# Patient Record
Sex: Male | Born: 1970
Health system: Southern US, Community
[De-identification: ages and names within clinical notes are randomized; demographics above are authoritative.]

## PROBLEM LIST (undated history)

## (undated) DIAGNOSIS — B20 Human immunodeficiency virus [HIV] disease: Secondary | ICD-10-CM

## (undated) DIAGNOSIS — Z21 Asymptomatic human immunodeficiency virus [HIV] infection status: Secondary | ICD-10-CM

## (undated) DIAGNOSIS — T7840XA Allergy, unspecified, initial encounter: Secondary | ICD-10-CM

## (undated) HISTORY — DX: Asymptomatic human immunodeficiency virus (hiv) infection status: Z21

## (undated) HISTORY — DX: Human immunodeficiency virus (HIV) disease: B20

## (undated) HISTORY — DX: Allergy, unspecified, initial encounter: T78.40XA

---

## 2007-07-18 ENCOUNTER — Encounter: Admission: RE | Admit: 2007-07-18 | Discharge: 2007-07-18 | Payer: Self-pay | Admitting: Internal Medicine

## 2007-07-19 ENCOUNTER — Inpatient Hospital Stay (HOSPITAL_COMMUNITY): Admission: EM | Admit: 2007-07-19 | Discharge: 2007-08-02 | Payer: Self-pay | Admitting: Emergency Medicine

## 2007-07-19 ENCOUNTER — Ambulatory Visit: Payer: Self-pay | Admitting: Internal Medicine

## 2007-07-21 ENCOUNTER — Encounter (INDEPENDENT_AMBULATORY_CARE_PROVIDER_SITE_OTHER): Payer: Self-pay | Admitting: *Deleted

## 2007-07-21 ENCOUNTER — Ambulatory Visit: Payer: Self-pay | Admitting: Infectious Diseases

## 2007-07-21 LAB — CONVERTED CEMR LAB: HCV Ab: NEGATIVE

## 2007-07-28 ENCOUNTER — Encounter (INDEPENDENT_AMBULATORY_CARE_PROVIDER_SITE_OTHER): Payer: Self-pay | Admitting: *Deleted

## 2007-07-28 ENCOUNTER — Encounter: Payer: Self-pay | Admitting: Internal Medicine

## 2007-07-28 LAB — CONVERTED CEMR LAB
Hep A Total Ab: NEGATIVE
Hepatitis B Surface Ag: NEGATIVE

## 2007-08-11 ENCOUNTER — Telehealth: Payer: Self-pay | Admitting: Internal Medicine

## 2007-08-14 ENCOUNTER — Telehealth: Payer: Self-pay | Admitting: Internal Medicine

## 2007-08-15 ENCOUNTER — Encounter (INDEPENDENT_AMBULATORY_CARE_PROVIDER_SITE_OTHER): Payer: Self-pay | Admitting: *Deleted

## 2007-08-15 ENCOUNTER — Ambulatory Visit: Payer: Self-pay | Admitting: Internal Medicine

## 2007-08-15 DIAGNOSIS — A310 Pulmonary mycobacterial infection: Secondary | ICD-10-CM | POA: Insufficient documentation

## 2007-08-15 DIAGNOSIS — Z87898 Personal history of other specified conditions: Secondary | ICD-10-CM | POA: Insufficient documentation

## 2007-08-15 DIAGNOSIS — A630 Anogenital (venereal) warts: Secondary | ICD-10-CM | POA: Insufficient documentation

## 2007-08-15 DIAGNOSIS — B20 Human immunodeficiency virus [HIV] disease: Secondary | ICD-10-CM | POA: Insufficient documentation

## 2007-08-15 DIAGNOSIS — B59 Pneumocystosis: Secondary | ICD-10-CM | POA: Insufficient documentation

## 2007-08-15 LAB — CONVERTED CEMR LAB

## 2007-09-06 ENCOUNTER — Encounter: Payer: Self-pay | Admitting: Internal Medicine

## 2007-09-26 ENCOUNTER — Encounter: Admission: RE | Admit: 2007-09-26 | Discharge: 2007-09-26 | Payer: Self-pay | Admitting: Internal Medicine

## 2007-09-26 ENCOUNTER — Ambulatory Visit: Payer: Self-pay | Admitting: Internal Medicine

## 2007-09-26 LAB — CONVERTED CEMR LAB
Alkaline Phosphatase: 69 units/L (ref 39–117)
CO2: 24 meq/L (ref 19–32)
Cholesterol: 163 mg/dL (ref 0–200)
Creatinine, Ser: 0.91 mg/dL (ref 0.40–1.50)
Glucose, Bld: 76 mg/dL (ref 70–99)
HCT: 40.7 % (ref 39.0–52.0)
HDL: 75 mg/dL (ref >39)
HIV-1 RNA Quant, Log: 1.74 — ABNORMAL HIGH (ref <1.70)
LDL Cholesterol: 74 mg/dL (ref 0–99)
MCHC: 32.7 g/dL (ref 30.0–36.0)
MCV: 85.7 fL (ref 78.0–100.0)
Platelets: 305 10*3/uL (ref 150–400)
RBC: 4.75 M/uL (ref 4.22–5.81)
Sodium: 139 meq/L (ref 135–145)
Total Bilirubin: 0.2 mg/dL — ABNORMAL LOW (ref 0.3–1.2)
Total CHOL/HDL Ratio: 2.2
Total Protein: 7.8 g/dL (ref 6.0–8.3)
Triglycerides: 68 mg/dL (ref <150)
VLDL: 14 mg/dL (ref 0–40)

## 2007-10-04 ENCOUNTER — Encounter (INDEPENDENT_AMBULATORY_CARE_PROVIDER_SITE_OTHER): Payer: Self-pay | Admitting: *Deleted

## 2007-10-06 ENCOUNTER — Telehealth: Payer: Self-pay | Admitting: Internal Medicine

## 2007-10-10 ENCOUNTER — Ambulatory Visit: Payer: Self-pay | Admitting: Internal Medicine

## 2007-10-10 DIAGNOSIS — J309 Allergic rhinitis, unspecified: Secondary | ICD-10-CM | POA: Insufficient documentation

## 2007-11-28 ENCOUNTER — Encounter: Payer: Self-pay | Admitting: Internal Medicine

## 2007-11-28 ENCOUNTER — Ambulatory Visit: Payer: Self-pay | Admitting: Internal Medicine

## 2008-01-01 ENCOUNTER — Ambulatory Visit: Payer: Self-pay | Admitting: Internal Medicine

## 2008-01-01 ENCOUNTER — Encounter: Admission: RE | Admit: 2008-01-01 | Discharge: 2008-01-01 | Payer: Self-pay | Admitting: Internal Medicine

## 2008-01-01 LAB — CONVERTED CEMR LAB
ALT: 29 units/L (ref 0–53)
AST: 20 units/L (ref 0–37)
Alkaline Phosphatase: 78 units/L (ref 39–117)
BUN: 9 mg/dL (ref 6–23)
Calcium: 9.3 mg/dL (ref 8.4–10.5)
Chloride: 104 meq/L (ref 96–112)
Creatinine, Ser: 0.75 mg/dL (ref 0.40–1.50)
HCT: 41.7 % (ref 39.0–52.0)
Hemoglobin: 13.9 g/dL (ref 13.0–17.0)
MCHC: 33.3 g/dL (ref 30.0–36.0)
MCV: 86.5 fL (ref 78.0–100.0)
Platelets: 339 10*3/uL (ref 150–400)
RDW: 14.4 % (ref 11.5–15.5)
Total Bilirubin: 0.3 mg/dL (ref 0.3–1.2)

## 2008-01-16 ENCOUNTER — Ambulatory Visit: Payer: Self-pay | Admitting: Internal Medicine

## 2008-02-09 ENCOUNTER — Encounter: Payer: Self-pay | Admitting: Internal Medicine

## 2008-04-29 ENCOUNTER — Ambulatory Visit: Payer: Self-pay | Admitting: Internal Medicine

## 2008-04-29 LAB — CONVERTED CEMR LAB
HIV 1 RNA Quant: <48 copies/mL (ref <48)
HIV-1 RNA Quant, Log: <1.68 (ref <1.68)

## 2008-06-04 ENCOUNTER — Ambulatory Visit: Payer: Self-pay | Admitting: Internal Medicine

## 2008-07-11 ENCOUNTER — Encounter: Payer: Self-pay | Admitting: Internal Medicine

## 2008-08-07 IMAGING — CR DG CHEST 1V PORT
1 series · 1 of 1 positions shown · non-contrast
Comparison: 07/23/07 and 07/20/07.

CLINICAL DATA: Cough/short of breath/follow-up pneumonia.
 PORTABLE CHEST - 1 VIEW 07/24/07:

[view not recorded]
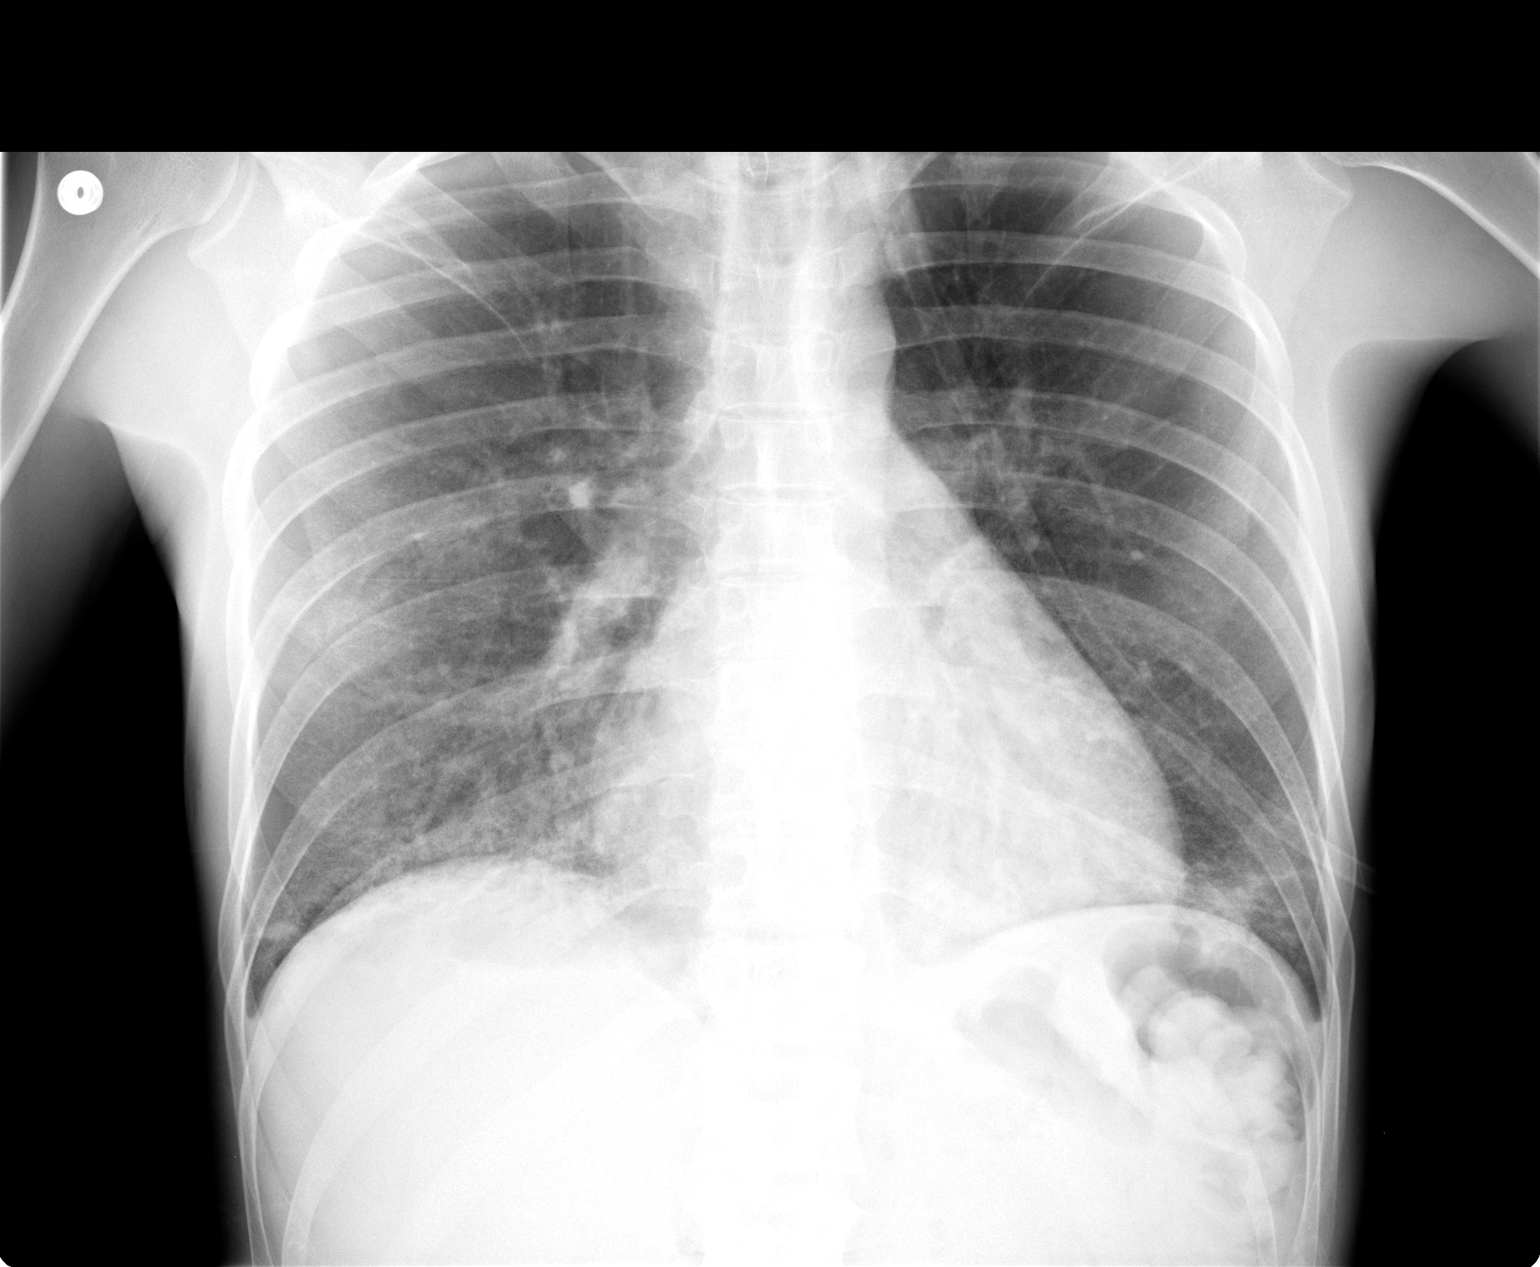

[1 of 1 positions shown; findings below may reference images not displayed]

FINDINGS: Bilateral lower lobe airspace densities about the same.  Borderline cardiomegaly without failure.
IMPRESSION: Bilateral lower lobe airspace disease without significant change.

## 2008-08-12 IMAGING — CR DG CHEST 2V
2 series · 2 of 2 positions shown · non-contrast
Comparison: none

CLINICAL DATA: Fever

[w chest pa]
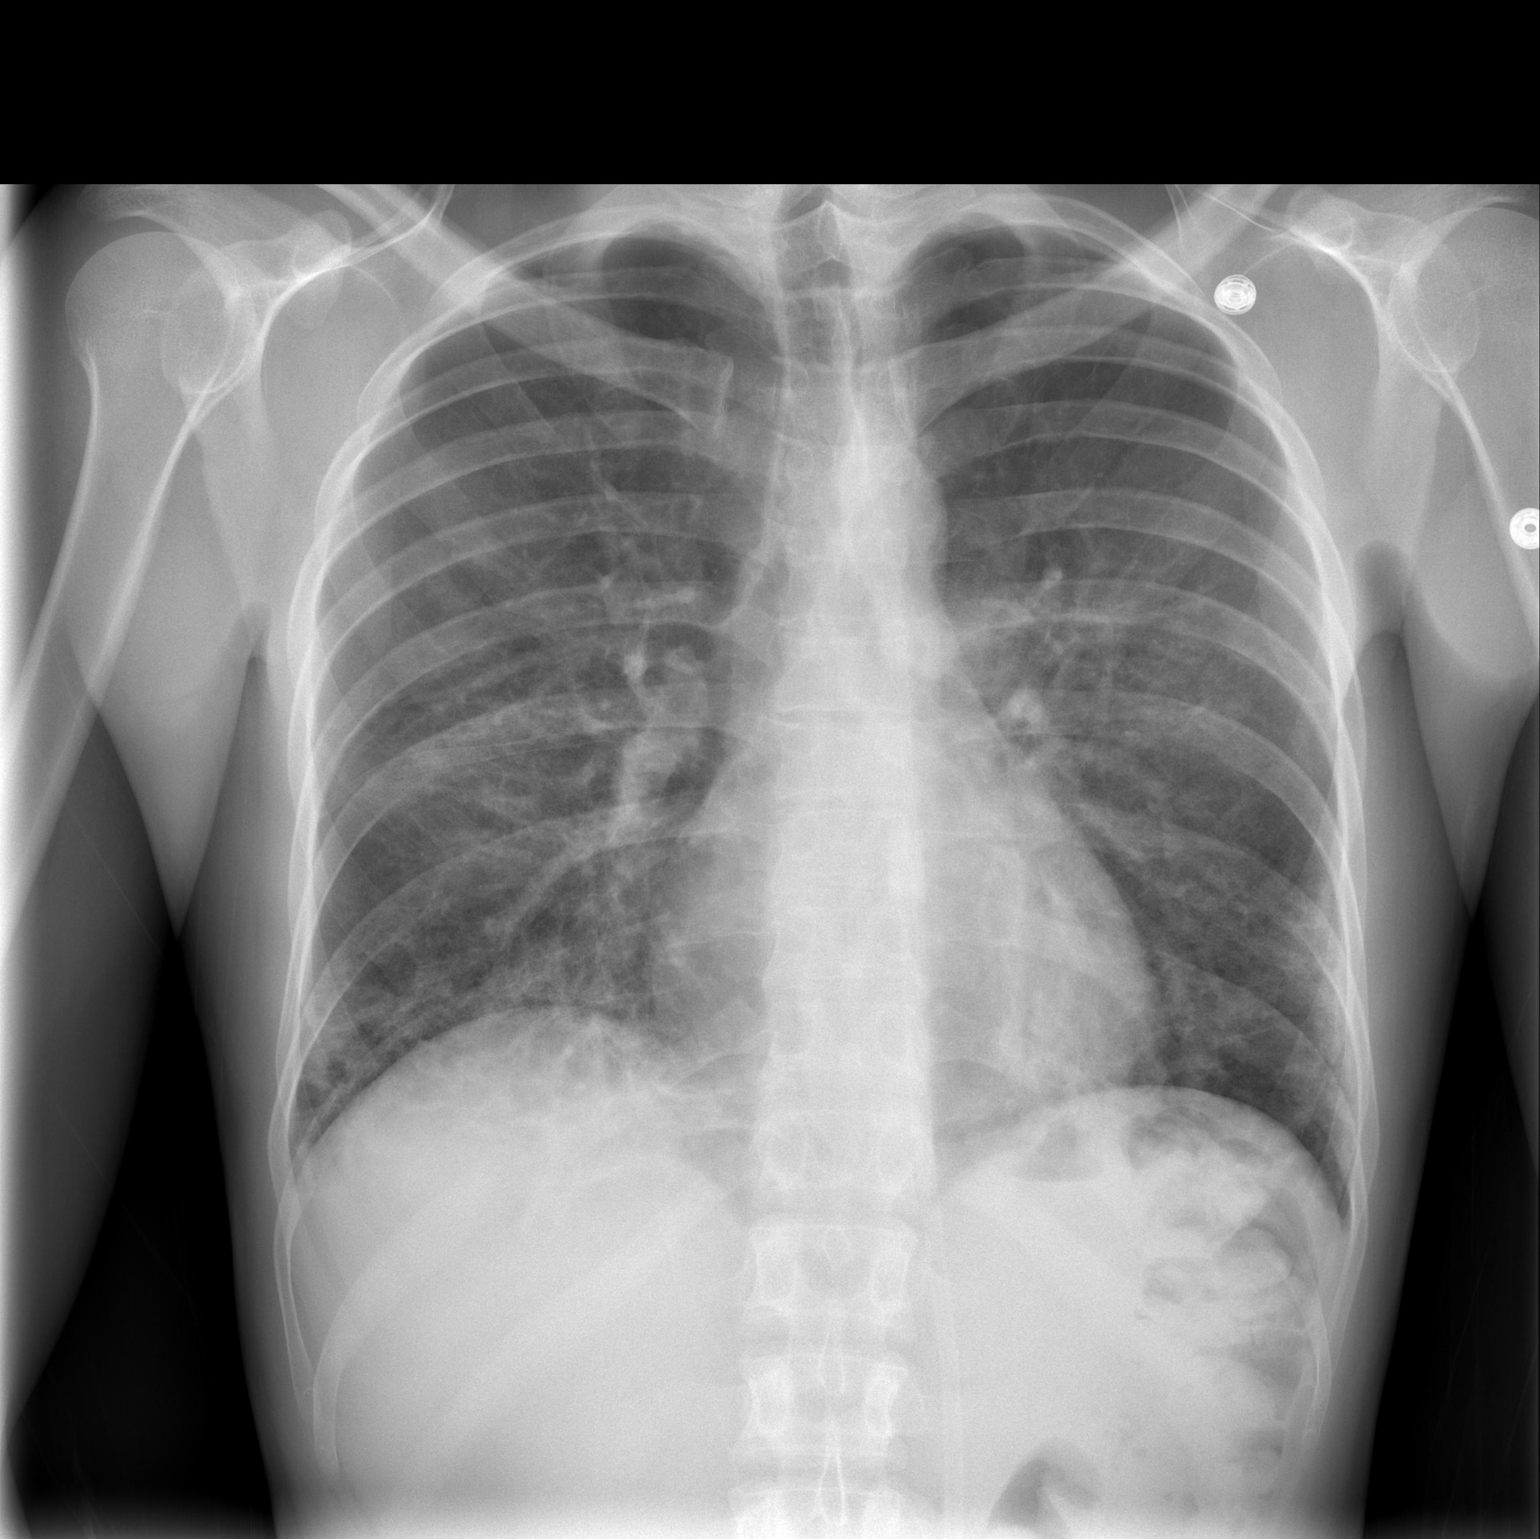

[w chest lat]
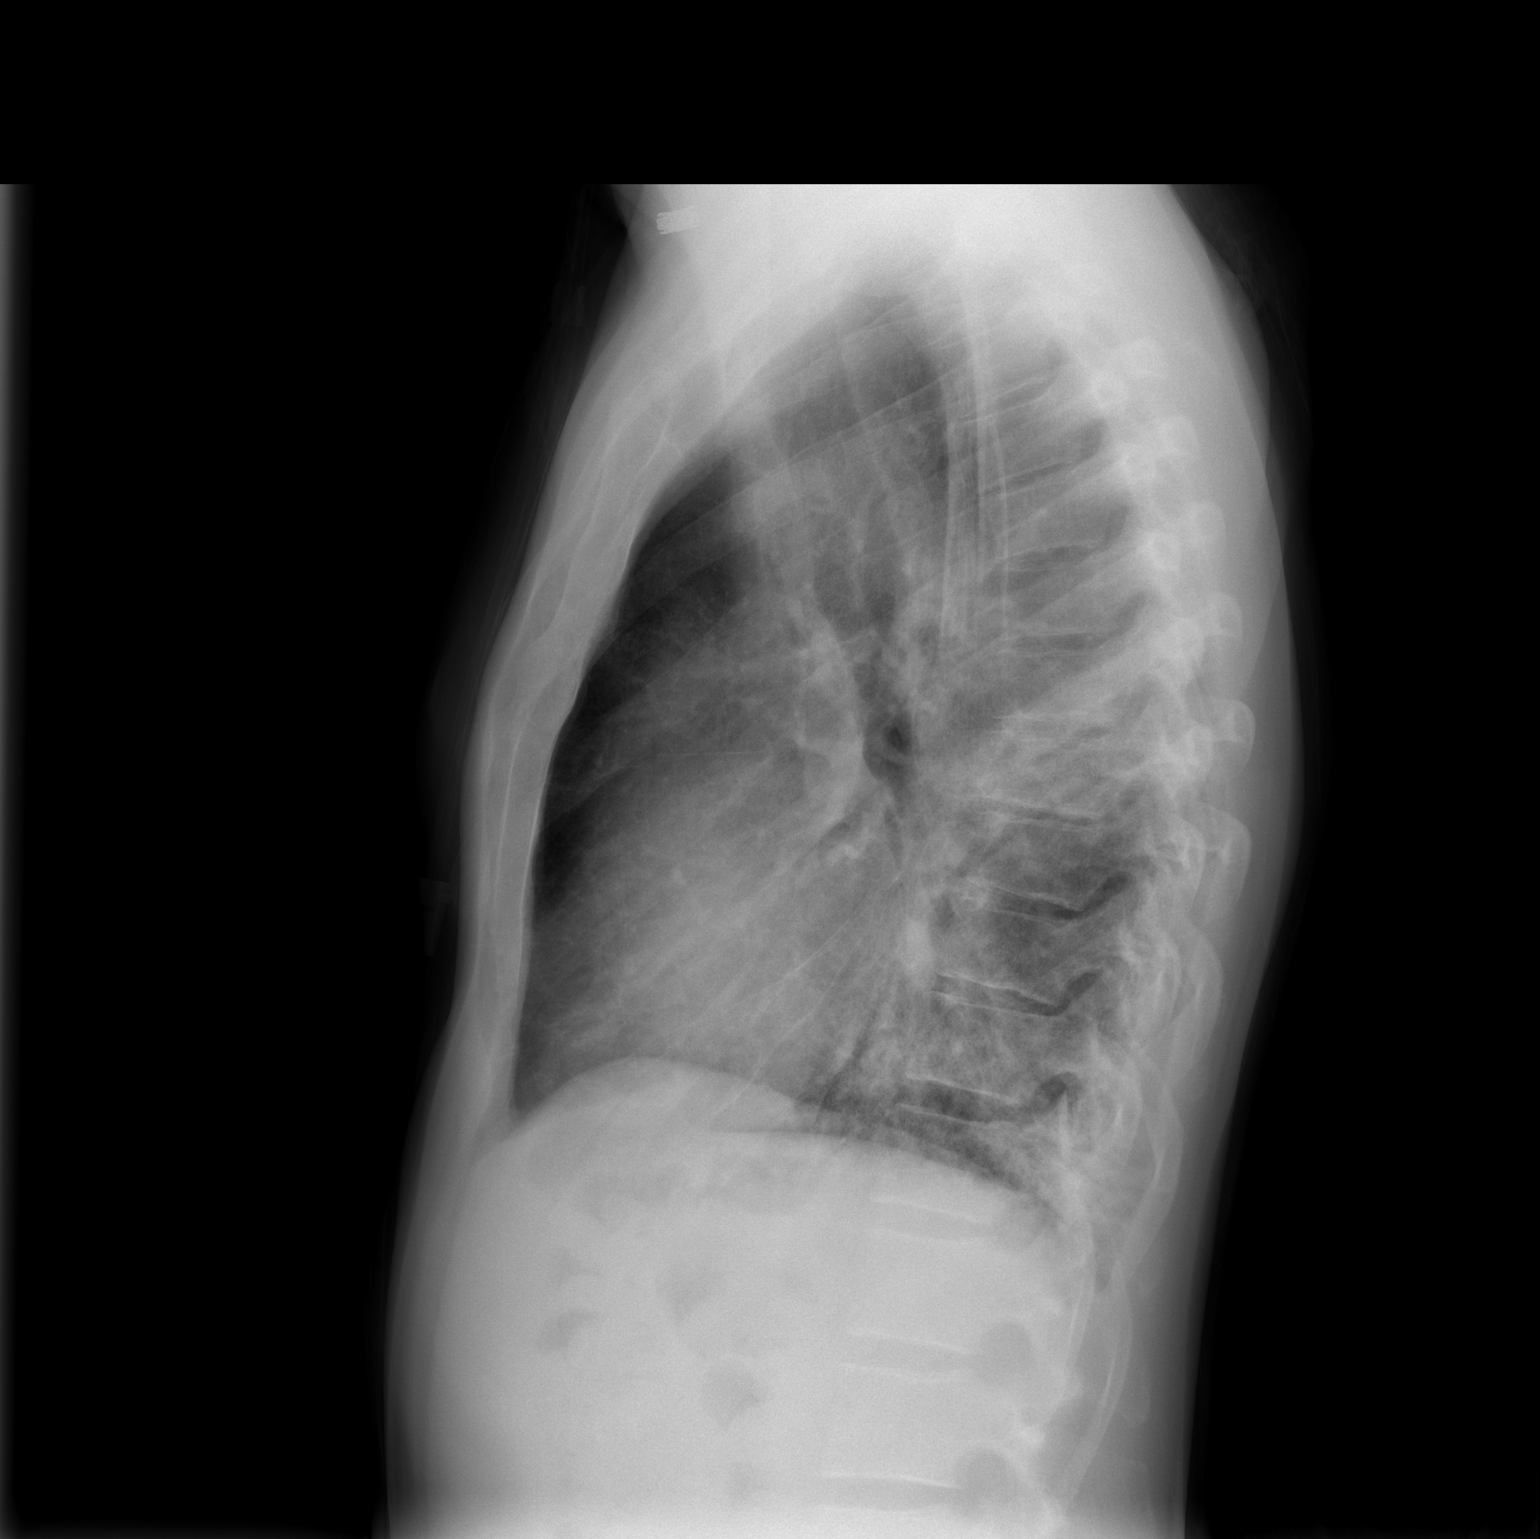

[2 of 2 positions shown; findings below may reference images not displayed]

Chest 2 view:

Comparison 07/24/2007 and earlier films. There are persistent bilateral lower lobe
and perihilar interstitial and airspace infiltrates. Since 07/20/2007 there's
been slight interval improvement. No definite effusion. Heart size remains
within normal limits. No definite adenopathy. Visualized bones unremarkable.
IMPRESSION: 1. Marginal improvement in bilateral perihilar and lower lobe infiltrates since
films dating back to 07/20/2007.

## 2008-09-03 ENCOUNTER — Ambulatory Visit: Payer: Self-pay | Admitting: Internal Medicine

## 2008-09-03 LAB — CONVERTED CEMR LAB
AST: 19 units/L (ref 0–37)
Albumin: 4.4 g/dL (ref 3.5–5.2)
Alkaline Phosphatase: 75 units/L (ref 39–117)
Basophils Absolute: 0 10*3/uL (ref 0.0–0.1)
Calcium: 9.3 mg/dL (ref 8.4–10.5)
Chloride: 100 meq/L (ref 96–112)
Glucose, Bld: 106 mg/dL — ABNORMAL HIGH (ref 70–99)
HCT: 41.2 % (ref 39.0–52.0)
HIV-1 RNA Quant, Log: <1.68 (ref <1.68)
Hemoglobin: 14.1 g/dL (ref 13.0–17.0)
LDL Cholesterol: 71 mg/dL (ref 0–99)
Lymphocytes Relative: 50 % — ABNORMAL HIGH (ref 12–46)
Lymphs Abs: 1.6 10*3/uL (ref 0.7–4.0)
Monocytes Absolute: 0.3 10*3/uL (ref 0.1–1.0)
Neutro Abs: 1.3 10*3/uL — ABNORMAL LOW (ref 1.7–7.7)
Potassium: 4 meq/L (ref 3.5–5.3)
RDW: 13.5 % (ref 11.5–15.5)
Sodium: 136 meq/L (ref 135–145)
Total Protein: 7.7 g/dL (ref 6.0–8.3)
WBC: 3.2 10*3/uL — ABNORMAL LOW (ref 4.0–10.5)

## 2008-09-17 ENCOUNTER — Ambulatory Visit: Payer: Self-pay | Admitting: Internal Medicine

## 2009-01-28 ENCOUNTER — Ambulatory Visit: Payer: Self-pay | Admitting: Internal Medicine

## 2009-01-28 LAB — CONVERTED CEMR LAB: HIV 1 RNA Quant: 155 copies/mL — ABNORMAL HIGH (ref <48)

## 2009-01-29 ENCOUNTER — Encounter (INDEPENDENT_AMBULATORY_CARE_PROVIDER_SITE_OTHER): Payer: Self-pay | Admitting: *Deleted

## 2009-02-25 ENCOUNTER — Encounter (INDEPENDENT_AMBULATORY_CARE_PROVIDER_SITE_OTHER): Payer: Self-pay | Admitting: *Deleted

## 2009-02-27 ENCOUNTER — Ambulatory Visit: Payer: Self-pay | Admitting: Internal Medicine

## 2009-09-22 ENCOUNTER — Ambulatory Visit: Payer: Self-pay | Admitting: Internal Medicine

## 2009-09-22 LAB — CONVERTED CEMR LAB
ALT: 20 units/L (ref 0–53)
Albumin: 4.7 g/dL (ref 3.5–5.2)
CO2: 27 meq/L (ref 19–32)
Calcium: 9.4 mg/dL (ref 8.4–10.5)
Chloride: 102 meq/L (ref 96–112)
Eosinophils Absolute: 0 10*3/uL (ref 0.0–0.7)
HDL: 78 mg/dL (ref >39)
HIV 1 RNA Quant: <48 copies/mL (ref <48)
Lymphs Abs: 2 10*3/uL (ref 0.7–4.0)
MCV: 89.5 fL (ref 78.0–100.0)
Neutrophils Relative %: 32 % — ABNORMAL LOW (ref 43–77)
Platelets: 342 10*3/uL (ref 150–400)
Potassium: 3.4 meq/L — ABNORMAL LOW (ref 3.5–5.3)
Sodium: 141 meq/L (ref 135–145)
Total Bilirubin: 0.3 mg/dL (ref 0.3–1.2)
Total Protein: 8 g/dL (ref 6.0–8.3)
WBC: 3.6 10*3/uL — ABNORMAL LOW (ref 4.0–10.5)

## 2009-10-14 ENCOUNTER — Ambulatory Visit: Payer: Self-pay | Admitting: Internal Medicine

## 2009-10-14 DIAGNOSIS — K612 Anorectal abscess: Secondary | ICD-10-CM | POA: Insufficient documentation

## 2010-05-12 ENCOUNTER — Ambulatory Visit: Payer: Self-pay | Admitting: Internal Medicine

## 2010-05-12 LAB — CONVERTED CEMR LAB
ALT: 22 units/L (ref 0–53)
AST: 18 units/L (ref 0–37)
Albumin: 4.5 g/dL (ref 3.5–5.2)
Alkaline Phosphatase: 63 units/L (ref 39–117)
Basophils Absolute: 0 10*3/uL (ref 0.0–0.1)
Basophils Relative: 1 % (ref 0–1)
Glucose, Bld: 96 mg/dL (ref 70–99)
HIV-1 RNA Quant, Log: <1.3 (ref <1.30)
MCHC: 33.3 g/dL (ref 30.0–36.0)
Neutro Abs: 0.8 10*3/uL — ABNORMAL LOW (ref 1.7–7.7)
Neutrophils Relative %: 22 % — ABNORMAL LOW (ref 43–77)
Platelets: 344 10*3/uL (ref 150–400)
Potassium: 3.7 meq/L (ref 3.5–5.3)
RDW: 13.3 % (ref 11.5–15.5)
Sodium: 139 meq/L (ref 135–145)
Total Protein: 7.5 g/dL (ref 6.0–8.3)

## 2010-05-28 ENCOUNTER — Encounter (INDEPENDENT_AMBULATORY_CARE_PROVIDER_SITE_OTHER): Payer: Self-pay | Admitting: *Deleted

## 2010-05-28 ENCOUNTER — Ambulatory Visit: Payer: Self-pay | Admitting: Internal Medicine

## 2010-07-21 NOTE — Miscellaneous (Signed)
Summary: Orders Update  Clinical Lists Changes  Orders: Added new Test order of T-CBC w/Diff ([id number]) - Signed Added new Test order of T-CD4SP ([facility]) (CD4SP) - Signed Added new Test order of T-Comprehensive Metabolic Panel ([id number]) - Signed Added new Test order of T-HIV Viral Load ([id number]) - Signed 

## 2010-07-21 NOTE — Assessment & Plan Note (Signed)
Summary: lab[mkj]  Prior Medications: ATRIPLA 600-200-300 MG TABS (EFAVIRENZ-EMTRICITAB-TENOFOVIR) Take 1 tablet by mouth once a day at bedtime ALDARA 5 %  CREA (IMIQUIMOD) apply to warts every M, W, F at bedtime as directed CLARITIN 10 MG  TABS (LORATADINE) Take 1 tablet by mouth once a day Current Allergies: No known allergies

## 2010-07-21 NOTE — Assessment & Plan Note (Signed)
Summary: F/U OV/VS   CC:  follow-up visit.  History of Present Illness: Andrew Alvarez is in for his routine visit. He states that he is doing well.  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  He's had no problems obtaining or tolerating his Atripla and does not recall missing a single dose.  His only other medication is occasional Claritin.  He is dating a new male partner but they are not sexually active.  Preventive Screening-Counseling & Management  Alcohol-Tobacco     Alcohol drinks/day: <1     Alcohol type: wine     Smoking Status: never     Passive Smoke Exposure: yes  Caffeine-Diet-Exercise     Caffeine use/day: yes     Does Patient Exercise: no     Type of exercise: walking     Times/week: 3     Exercise Counseling: to improve exercise regimen  Hep-HIV-STD-Contraception     HIV Risk: risk noted     HIV Risk Counseling: not indicated-no HIV risk noted     STD Risk: no risk noted  Safety-Violence-Falls     Seat Belt Use: yes      Sexual History:  dating.        Drug Use:  never.     Updated Prior Medication List: ATRIPLA 600-200-300 MG TABS (EFAVIRENZ-EMTRICITAB-TENOFOVIR) Take 1 tablet by mouth once a day at bedtime CLARITIN 10 MG  TABS (LORATADINE) Take 1 tablet by mouth once a day  Current Allergies (reviewed today): No known allergies  Social History: Sexual History:  dating  Vital Signs:  Patient profile:   40 year old male Height:      70 inches (177.80 cm) Weight:      144.5 pounds (65.68 kg) BMI:     20.81 Temp:     98.4 degrees F (36.89 degrees C) oral Pulse rate:   69 / minute BP sitting:   119 / 81  (left arm)  Vitals Entered By: Jennet Maduro RN (May 28, 2010 11:15 AM) CC: follow-up visit Is Patient Diabetic? No Pain Assessment Patient in pain? no      Nutritional Status BMI of 19 -24 = normal Nutritional Status Detail appetite "good"  Have  you ever been in a relationship where you felt threatened, hurt or afraid?No   Does patient need assistance? Functional Status Self care Ambulation Normal Comments no missed doses   Physical Exam  General:  alert and well-nourished.   Mouth:  good dentition and pharynx pink and moist.   Lungs:  normal breath sounds.  no crackles and no wheezes.   Heart:  normal rate, regular rhythm, and no murmur.   Skin:  no rashes.   Axillary Nodes:  no R axillary adenopathy and no L axillary adenopathy.   Psych:  normally interactive, good eye contact, not anxious appearing, and not depressed appearing.          Medication Adherence: 05/28/2010   Adherence to medications reviewed with patient. Counseling to provide adequate adherence provided   Prevention For Positives: 05/28/2010   Safe sex practices discussed with patient. Condoms offered.                              Impression & Recommendations:  Problem # 1:  HIV DISEASE (ICD-042) Kennen's the appearance is excellent and his HIV remains  undetectable.  He has had excellent CD4 reconstitution over the last two years since starting therapy and his CD4 count is now back in the normal range.  I will continue his current regimen.  I have talked to him about to the importance of protecting himself and his partner if he does decide to become sexually active. Diagnostics Reviewed:  HIV: CDC-defined AIDS (09/17/2008)   CD4: 450 (05/13/2010)   WBC: 3.4 (05/12/2010)   Hgb: 14.2 (05/12/2010)   HCT: 42.7 (05/12/2010)   Platelets: 344 (05/12/2010) HIV-1 RNA: <20 copies/mL (05/12/2010)   HBSAg: neg (07/28/2007)  Other Orders: Est. Patient Level III (10272) Future Orders: T-CD4SP ([facility]) (CD4SP) ... 11/24/2010 T-HIV Viral Load [phone]) ... 11/24/2010 T-Comprehensive Metabolic Panel [id number]) ... 11/24/2010 T-CBC w/Diff ([id number]) ... 11/24/2010 T-RPR (Syphilis) [phone]) ... 11/24/2010 T-Lipid Profile [phone]) ...  11/24/2010  Patient Instructions: 1)  Please schedule a follow-up appointment in 6 months.   Influenza Immunization History:    Influenza # 1:  Historical (03/23/2010)

## 2010-07-21 NOTE — Miscellaneous (Signed)
Summary: RW Financial Update  Clinical Lists Changes  Observations: Added new observation of YEARLYEXPEN: 0  (05/28/2010 12:32) Added new observation of RWTITLE: B  (05/28/2010 12:32) Added new observation of PCTFPL: 709.57  (05/28/2010 12:32) Added new observation of HOUSEINCOME: 16109  (05/28/2010 12:32) Added new observation of AIDSDAP: No  (05/28/2010 12:32) Added new observation of FINASSESSDT: 05/28/2010  (05/28/2010 12:32)

## 2010-07-21 NOTE — Assessment & Plan Note (Signed)
Summary: f/u [mkj]   CC:  follow-up visit.  History of Present Illness: Andrew Alvarez is in for his routine visit.  He has not missed a single dose of his medications.  He has only had one new problem since his last visit.  About one month ago he began to notice some drainage from a small lesion on his left buttocks.  Initially he thought it was a perirectal wart and continued to use his Aldara cream. However, the drainage increased so he decided to self treat first with Septra, then Zithromax, then Diflucan.  The drainage has decreased but not stopped completely.  He has had no pain.  He has not had any fever.  He has not been sexually active since his last visit.  Preventive Screening-Counseling & Management  Alcohol-Tobacco     Alcohol drinks/day: <1     Alcohol type: wine     Smoking Status: never     Passive Smoke Exposure: yes  Caffeine-Diet-Exercise     Caffeine use/day: yes     Does Patient Exercise: no     Type of exercise: walking     Times/week: 3     Exercise Counseling: to improve exercise regimen  Hep-HIV-STD-Contraception     HIV Risk: risk noted     HIV Risk Counseling: not indicated-no HIV risk noted     STD Risk: no risk noted  Safety-Violence-Falls     Seat Belt Use: yes  Comments: declined condoms      Sexual History:  n/a.        Drug Use:  never.     Prior Medication List:  ATRIPLA 600-200-300 MG TABS (EFAVIRENZ-EMTRICITAB-TENOFOVIR) Take 1 tablet by mouth once a day at bedtime ALDARA 5 %  CREA (IMIQUIMOD) apply to warts every M, W, F at bedtime as directed CLARITIN 10 MG  TABS (LORATADINE) Take 1 tablet by mouth once a day   Current Allergies (reviewed today): No known allergies  Social History: Sexual History:  n/a  Vital Signs:  Patient profile:   40 year old male Height:      70 inches Weight:      140.25 pounds Temp:     97.7 degrees F oral Pulse rate:   79 / minute BP sitting:   115 / 79  (left arm) Cuff size:   regular  Vitals Entered  By: Jennet Maduro RN (October 14, 2009 2:38 PM) CC: follow-up visit Is Patient Diabetic? No Pain Assessment Patient in pain? no      Nutritional Status BMI of 19 -24 = normal Nutritional Status Detail appetite "good"  Have you ever been in a relationship where you felt threatened, hurt or afraid?No   Does patient need assistance? Functional Status Self care Ambulation Normal Comments no missed doses of rxes   Physical Exam  General:  alert and well-nourished.   Mouth:  good dentition and pharynx pink and moist.   Lungs:  normal breath sounds.  no crackles and no wheezes.   Heart:  normal rate, regular rhythm, and no murmur.   Rectal:  has a small hypopigmented nodule in the perirectal area at the 8 o'clock position.  It is nontender and without drainage at the current time.        Medication Adherence: 10/14/2009   Adherence to medications reviewed with patient. Counseling to provide adequate adherence provided   Prevention For Positives: 10/14/2009   Safe sex practices discussed with patient. Condoms offered.  Impression & Recommendations:  Problem # 1:  HIV DISEASE (ICD-042) His HIV infection is under much better control due to his excellent adherence.  I will continue his current regimen. Diagnostics Reviewed:  HIV: CDC-defined AIDS (09/17/2008)   CD4: 400 (09/23/2009)   WBC: 3.6 (09/22/2009)   Hgb: 14.4 (09/22/2009)   HCT: 44.5 (09/22/2009)   Platelets: 342 (09/22/2009) HIV-1 RNA: <48 copies/mL (09/22/2009)   HBSAg: neg (07/28/2007)  His updated medication list for this problem includes:    Augmentin 875-125 Mg Tabs (Amoxicillin-pot clavulanate) .Marland Kitchen... Take 1 tablet by mouth two times a day  Problem # 2:  ABSCESS, PERIRECTAL (ICD-566) I will treat him empirically for a small perirectal abscess.  Have asked him to call me if he continues to have drainage. Orders: Est. Patient Level III (91478)  Medications Added to Medication  List This Visit: 1)  Augmentin 875-125 Mg Tabs (Amoxicillin-pot clavulanate) .... Take 1 tablet by mouth two times a day  Other Orders: Future Orders: T-CD4SP ([facility]) (CD4SP) ... 04/12/2010 T-HIV Viral Load [id number]) ... 04/12/2010 T-Basic Metabolic Panel [id number]) ... 04/12/2010  Patient Instructions: 1)  Please schedule a follow-up appointment in 6 months. Prescriptions: AUGMENTIN 875-125 MG TABS (AMOXICILLIN-POT CLAVULANATE) Take 1 tablet by mouth two times a day  #20 x 0   Entered and Authorized by:   Cliffton Asters MD   Signed by:   Cliffton Asters MD on 10/14/2009   Method used:   Print then Give to Patient   RxID:   [id number]   Prevention & Chronic Care Immunizations   Influenza vaccine: Not documented    Tetanus booster: Not documented    Pneumococcal vaccine: Pneumovax  (08/15/2007)  Other Screening   Smoking status: never  (10/14/2009)  Lipids   Total Cholesterol: 165  (09/22/2009)   LDL: 77  (09/22/2009)   LDL Direct: Not documented   HDL: 78  (09/22/2009)   Triglycerides: 51  (09/22/2009)         Medication Adherence: 10/14/2009   Adherence to medications reviewed with patient. Counseling to provide adequate adherence provided    Prevention For Positives: 10/14/2009   Safe sex practices discussed with patient. Condoms offered.

## 2010-09-01 LAB — T-HELPER CELL (CD4) - (RCID CLINIC ONLY): CD4 T Cell Abs: 450 uL (ref 400–2700)

## 2010-10-01 LAB — T-HELPER CELL (CD4) - (RCID CLINIC ONLY): CD4 % Helper T Cell: 12 % — ABNORMAL LOW (ref 33–55)

## 2010-11-03 NOTE — Consult Note (Signed)
Andrew Alvarez, Andrew Alvarez                 ACCOUNT NO.:  [id number]   MEDICAL RECORD NO.:  [id number]          PATIENT TYPE:  INP   LOCATION:  1310                         FACILITY:  [facility]   PHYSICIAN:  Casimiro Needle B. Sherene Sires, MD, FCCPDATE OF BIRTH:  [birth year] 06, 1970   DATE OF CONSULTATION:  07/24/2007  DATE OF DISCHARGE:                                 CONSULTATION   REFERRING PHYSICIAN:  Wilson Singer, M.D.   REASON FOR CONSULTATION:  Respiratory failure.   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  CD-4 count had returned 10.  There has been no  specific diagnosis made, but he has already been seen by infectious  disease and started him on an appropriate PCP coverage along with  steroids because of hypoxemic respiratory failure.   Presently, the patient is able to speak in full sentences.  He denies  dyspnea at rest on oxygen at 50%.  He is not able to cough up any  sputum.  Denies any pleuritic pain, difficulty swallowing, overt sinus  or reflux symptoms, myalgias, arthralgias or unusual exposure history  other than to HIV.   PAST HISTORY:  Significant only for remote tonsillectomy.   SOCIAL HISTORY:  He is not a smoker.  He works as a Engineer, civil (consulting).   FAMILY HISTORY:  Negative for respiratory disease.   REVIEW OF SYSTEMS:  Taken in detail, the patient had a negative  examination.   PHYSICAL EXAMINATION:  GENERAL:  This is an alert, pleasant black male  in no acute distress with adequate saturations on 50%.  HEENT:  Unremarkable.  Oropharynx is clear.  Dentition intact.  NECK:  Without cervical adenopathy or tenderness.  His trachea is  midline.  No thyromegaly.  LUNGS:  Fields reveal minimal coarsening of breath sounds with no  significant crackles on inspiration.  No wheeze on expiration.  HEART:  Regular rhythm without murmur, rub or increase in P2.  ABDOMEN:  Soft, benign.  EXTREMITIES:  Warm without calf tenderness, cyanosis, clubbing or edema.   LABORATORY DATA:  On 50% oxygen yesterday, he had a pH of 7.43, PCO2 of  35, PO2 72.7.  His white count is normal as are his platelets.  LDH has  not been obtained.   DIAGNOSTICS:  Chest x-ray suggests a diffuse pattern consistent with PCP  with slight improvement by the chest x-ray done on July 23, 2007  compared to admission.   IMPRESSION:  Classic Pneumocystis carinii pneumonia occurring in the  setting of human immunodeficiency virus positivity with CD-4 count of  10.  We should be able to achieve a diagnosis by induced sputum.  Even  if we cannot, this is such a classic pattern and unless he fails to  respond to appropriate empiric therapy, I would not recommend performing  a bronchoscopy with lavage.  I reassured the patient that he appears to be heading in the right  direction.  I would not add anything at this point in addition to what  ID has recommended.   FIO2 can be adjusted downward as tolerates.  By the time of discharge  hopefully, he will not require oxygen or any further pulmonary followup.      [name]. Sherene Sires, MD, [facility]  Electronically Signed     MBW/MEDQ  D:  07/24/2007  T:  07/25/2007  Job:  [id number]

## 2010-11-03 NOTE — Discharge Summary (Signed)
NAMED'Andrew Alvarez, Andrew Alvarez                 ACCOUNT NO.:  [id number]   MEDICAL RECORD NO.:  [id number]          PATIENT TYPE:  INP   LOCATION:  1310                         FACILITY:  [facility]   PHYSICIAN:  Wilson Singer, M.D.DATE OF BIRTH:  05/29/1971   DATE OF ADMISSION:  07/19/2007  DATE OF DISCHARGE:                               DISCHARGE SUMMARY   Interim Discharge Summary.   DATE OF DISCHARGE:  To be determined.   DIAGNOSES:  1. 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  2. Human immunodeficiency virus/AIDS.   MEDICATIONS ON DISCHARGE:  To be determined.   HISTORY:  This is a very pleasant 40 year old homosexual man came in  with a 10-day history of nonproductive cough with increasing shortness  of breath on exertion.  Please see initial history and physical  examination done by Dr. Lilly Cove.   HOSPITAL PROGRESS:  The patient was admitted and the initial suspicion  was one of an atypical pneumonia probably PCP in view of his sexual  history.  He did agreed to an HIV test which was positive.  He was  started on Bactrim DS as well as Avelox..  Since admission, he has  continued to require increasing amounts of oxygen, and currently is on  50% oxygen by Ventimask.  He is also on steroids.  His CD-4 count has  come back as 10, and his T-helper cells are 3%.  He has been seen by  infectious disease and pulmonology.  The Avelox has now been  discontinued, and he continues on Bactrim DS and Diflucan for what is  probably a contamination with Candida albicans. Today he is still  requiring high levels of oxygen.  The fevers have subsided.   PHYSICAL EXAMINATION:  On examination today, temperature 98.5, blood  pressure 106/63, pulse 86, respiration 98% on 50% oxygen.  LUNGS:  Sound clinically clear as one would expect.  HEART:  Sounds are present and normal.   INVESTIGATIONS TODAY:  Show sodium 133, potassium 5.4, bicarbonate 26,  BUN 15, creatinine 0.93.  Hemoglobin  12.7,  white blood cell count 8.6,  platelets 727.  Interestingly, his AST and ALT are both significantly  raised at 187 and 250 respectively.  I am not sure of the cause of this  elevation in liver enzymes unless it is due to medications, which is  possibly likely.   He will continue on current medications, and one would expect that this  particular episode should improve.  His LDH is raised, and a this  apparently puts him at high risk for ventilator-dependent respiratory  failure on this occasion.  Hepatitis serology has been negative.      Wilson Singer, M.D.  Electronically Signed     NCG/MEDQ  D:  07/25/2007  T:  07/25/2007  Job:  [id number]

## 2010-11-03 NOTE — Discharge Summary (Signed)
NAMENAVEEN, Andrew Alvarez                 ACCOUNT NO.:  [id number]   MEDICAL RECORD NO.:  [id number]          PATIENT TYPE:  INP   LOCATION:  1310                         FACILITY:  [facility]   PHYSICIAN:  Lonia Blood, M.D.       DATE OF BIRTH:  July 01, 1970   DATE OF ADMISSION:  07/19/2007  DATE OF DISCHARGE:                               DISCHARGE SUMMARY   INTERIM DISCHARGE SUMMARY:   DISCHARGE DIAGNOSES:  Refer to previously dictated discharge summary.   DISCHARGE MEDICATIONS/DISPOSITION:  Will be dictated at the actual time  of the discharge of this patient.   PROCEDURES/HOSPITAL COURSE:  For procedures and hospital course covering  the period from July 19, 2007, until July 25, 2007, refer to  previously dictated discharge summary dictated done by Dr. Karilyn Cota.  This current dictation will cover the hospital course starting on  July 26, 2007, until July 31, 2007.  During this period of time,  Andrew Alvarez has made good progress.  His hypoxic respiratory failure has  improved, and the patient was titrated down fora pulmonary Venturi mask  to 2 L oxygen for nasal cannula.  His oxygen saturations have been in  the high 90s on 2 L per nasal cannula.  The patient is still  desaturating on room air when ambulating.  Andrew Alvarez has been continued  on Septra DS 1 tablet t.i.d., and he has a planned 3-week course of  treatment.  He is currently day 12 out of 21.  Andrew Alvarez steroids have  been tapered, and he is currently on prednisone 40 mg daily.  The chest  x-ray, though, has not shown resolution of the infiltrate, though that  is probably expected.  Andrew Alvarez had developed a brief episode of  hyperkalemia due to the potassium-sparing diuretic effect of the  triamterene of the sulfa/trimethoprim in the Septra antibiotic.  This  responded well to 2 doses of Kayexalate.  The measured potassium level  on the day of my dictation is 3.6.   Starting July 29, 2007, Andrew Alvarez had recurrent  fevers.  He had blood  cultures, urine culture, repeat chest x-ray and a serum cryptococcal  antigen measurement.  The blood cultures and urine culture are negative,  and the chest x-ray does not show new infiltrate.  Serum cryptococcal  antigen is negative as well.  The patient has been observed off Tylenol,  and he has continued to spike temperatures.  At this point in time, I  have thought the patient's temperatures are related to his primary  underlying diagnosis of HIV.  Andrew Alvarez will be evaluated by infectious  disease specialist, Dr. Cliffton Asters, today, and he will have workup as  he deems appropriate.      Lonia Blood, M.D.  Electronically Signed     SL/MEDQ  D:  07/31/2007  T:  08/01/2007  Job:  [id number]

## 2010-11-03 NOTE — Discharge Summary (Signed)
Andrew Alvarez, Andrew Alvarez                 ACCOUNT NO.:  [id number]   MEDICAL RECORD NO.:  [id number]          PATIENT TYPE:  INP   LOCATION:  1310                         FACILITY:  [facility]   PHYSICIAN:  Marcellus Scott, MD     DATE OF BIRTH:  [birth year]   DATE OF ADMISSION:  07/19/2007  DATE OF DISCHARGE:  08/02/2007                               DISCHARGE SUMMARY   PRIMARY MEDICAL DOCTOR:  Della Goo, M.D.   INFECTIOUS DISEASE:  Cliffton Asters, M.D.   This is an addendum to the discharge summary that was done by Dr. Lavera Guise  on July 31, 2007.  It will update events since February 9, to date.   DISCHARGE DIAGNOSES:  1. Pneumocystis carinii pneumonia.  2. Hypoxia, secondary to Problem #1.  3. Human immunodeficiency virus/acquired immune deficiency syndrome.  4. Chronic hyponatremia.   DISCHARGE MEDICATIONS:  1. Septra DS 2 tablets p.o. three times a day for 7 days, then one      p.o. daily.  2. Prednisone 20 mg p.o. daily for 7 days.  3. Diflucan 100 mg p.o. weekly.  4. Zithromax 1200 mg p.o. weekly.  5. Colace 100 mg p.o. b.i.d.  6. Mucinex 600 mg p.o. b.i.d.   PROCEDURES SINCE FEBRUARY 9:  None.   PERTINENT LABS SINCE FEB 9:  HIV 1 RNA quantitative 313,000 copies/mL;  HIV 1 RNA quantitative log is 5.50.  Basic metabolic panel today reveals  sodium 130, potassium 3.7, chloride 96, bicarb 24, glucose 127, BUN 11,  creatinine 0.75, calcium 8.5.   CONTINUED CONSULTATIONS:  Dr. Cliffton Asters of infectious disease.   HOSPITAL COURSE AND PATIENT DISPOSITION:  Since July 31, 2007,  patient has continued to progressively do well.  He has minimal to mild  dyspnea on exertion.  However, after he had ambulated the hallways in  the hospital on room air, patient's oxygen saturations are in the 90s.  Patient had some spikes of temperature on February 9.  However, there  was no further source of sepsis identified.  These temperatures have  promptly settled.  Patient has been followed  by Dr. Orvan Falconer.  He is, at  this point, stable with stable vital signs, clear lung findings on  auscultation.  He has chronic hyponatremia, which may be secondary to  SIADH-like picture or secondary to his PCP pneumonia.  Patient will be  discharged to follow up as an outpatient with his family medical doctor.  He will be  contacted by the infectious disease specialist with early appointment to  be seen at the ID clinic to initiate HAART therapy and further manage  his condition.  He has been instructed to seek immediate medical  attention if there is any further deterioration of his condition.      Marcellus Scott, MD  Electronically Signed     AH/MEDQ  D:  08/02/2007  T:  08/02/2007  Job:  [id number]   cc:   Della Goo, M.D.  Fax: [phone]   Cliffton Asters, M.D.  Fax: [phone]

## 2010-11-03 NOTE — Discharge Summary (Signed)
Andrew Alvarez, Andrew Alvarez                 ACCOUNT NO.:  [id number]   MEDICAL RECORD NO.:  [id number]          PATIENT TYPE:  INP   LOCATION:  1310                         FACILITY:  [facility]   PHYSICIAN:  Marcellus Scott, MD     DATE OF BIRTH:  [birth year]   DATE OF ADMISSION:  07/19/2007  DATE OF DISCHARGE:                               DISCHARGE SUMMARY   Dictation ended at this point.      Marcellus Scott, MD     AH/MEDQ  D:  08/02/2007  T:  08/02/2007  Job:  [id number]

## 2010-11-03 NOTE — H&P (Signed)
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  He had gone to see  his primary care physician, who initially had put him on ciprofloxacin  and more recently in the last few days has put him on azithromycin and  he does not seem to have improved.  He is a homosexual and has not ever  had an HIV test.  He did have a diagnosis of shingles in October 2008.   PAST SURGICAL HISTORY:  Tonsillectomy.   PAST MEDICAL HISTORY:  No serious illnesses.  Episode of shingles as  mentioned above.   SOCIAL HISTORY:  He is single.  He is gay.  He does not smoke.  He  occasionally drinks alcohol.  He works as a Designer, jewellery in the  emergency room.   FAMILY HISTORY:  Noncontributory.   REVIEW OF SYSTEMS:  Apart from the symptoms mentioned above, there are  no other symptoms referable to all systems of 12-point systems review.   PHYSICAL EXAMINATION:  Temperature 100 degrees Fahrenheit, blood  pressure 110/67, pulse 110, respiratory rate 14-16, saturation at one  point was 88%, I believe on room air.  Now he is on oxygen and  saturating 100%.  He is in no respiratory distress.  There is no peripheral or central  cyanosis.  CARDIOVASCULAR:  Heart sounds are present and normal without murmurs.  RESPIRATORY:  Lung fields are clear and, surprisingly, there are no  crackles or wheezes heard.  ABDOMEN:  Soft and nontender with no hepatosplenomegaly.  NEUROLOGICAL:  He is alert and oriented and without any focal  neurological signs.   INVESTIGATIONS:  Chest x-ray is indicative of bilateral lower lobe  infiltrates.  Sodium 136, potassium 3.9, bicarbonate 26, glucose 130,  BUN 3, creatinine 0.79.  Hemoglobin 13.4, white blood cell count 11.1,  platelets 452.   IMPRESSION:  Bilateral lower lobe pneumonia, clinical atypical  pneumonia, possibly Pneumocystis carinii pneumonia.   PLAN:  1. Admit.  2. Intravenous Avelox and Bactrim DS.  3. HIV test if the patient signs consent.   Further recommendations will depend on the patient's hospital progress.      Wilson Singer, M.D.  Electronically Signed     NCG/MEDQ  D:  07/19/2007  T:  07/20/2007  Job:  [id number]   cc:   Della Goo, M.D.  Fax: [phone]

## 2011-03-11 LAB — COMPREHENSIVE METABOLIC PANEL
ALT: 28
AST: 34
AST: 47 — ABNORMAL HIGH
Albumin: 2.3 — ABNORMAL LOW
Alkaline Phosphatase: 82
BUN: 10
CO2: 23
Calcium: 8.4
Chloride: 107
Creatinine, Ser: 0.85
GFR calc Af Amer: >60
GFR calc non Af Amer: >60
Glucose, Bld: 122 — ABNORMAL HIGH
Glucose, Bld: 97
Potassium: 4
Sodium: 133 — ABNORMAL LOW
Total Bilirubin: 0.4
Total Protein: 7.4

## 2011-03-11 LAB — BASIC METABOLIC PANEL
Chloride: 99
Creatinine, Ser: 0.79
GFR calc Af Amer: >60
GFR calc Af Amer: >60
GFR calc non Af Amer: >60
GFR calc non Af Amer: >60
Potassium: 3.9
Potassium: 4.3
Sodium: 132 — ABNORMAL LOW

## 2011-03-11 LAB — P CARINII SMEAR DFA: Pneumocystis carinii DFA: NEGATIVE

## 2011-03-11 LAB — HEPATITIS PANEL, ACUTE
HCV Ab: NEGATIVE
Hep A IgM: NEGATIVE
Hepatitis B Surface Ag: NEGATIVE

## 2011-03-11 LAB — CBC
HCT: 33.3 — ABNORMAL LOW
HCT: 34.9 — ABNORMAL LOW
Hemoglobin: 11.7 — ABNORMAL LOW
Hemoglobin: 11.7 — ABNORMAL LOW
MCHC: 33.8
MCV: 82
MCV: 82.6
RBC: 4.03 — ABNORMAL LOW
RBC: 4.8
RDW: 11.9
WBC: 11.1 — ABNORMAL HIGH
WBC: 8.1
WBC: 8.3

## 2011-03-11 LAB — URINALYSIS, ROUTINE W REFLEX MICROSCOPIC
Bilirubin Urine: NEGATIVE
Glucose, UA: NEGATIVE
Hgb urine dipstick: NEGATIVE
Ketones, ur: NEGATIVE
Protein, ur: NEGATIVE
pH: 7

## 2011-03-11 LAB — DIFFERENTIAL
Eosinophils Relative: 0
Lymphocytes Relative: 6 — ABNORMAL LOW
Lymphs Abs: 0.7
Monocytes Relative: 3

## 2011-03-11 LAB — CULTURE, BLOOD (ROUTINE X 2): Culture: NO GROWTH

## 2011-03-11 LAB — T-HELPER CELLS (CD4) COUNT (NOT AT ARMC): CD4 % Helper T Cell: 3 — ABNORMAL LOW

## 2011-03-11 LAB — CULTURE, RESPIRATORY W GRAM STAIN: Culture: NORMAL

## 2011-03-11 LAB — CRYPTOCOCCAL ANTIGEN: Crypto Ag: NEGATIVE

## 2011-03-11 LAB — HIV-1 RNA, QUALITATIVE, TMA: HIV-1 RNA, Qualitative, TMA: UNDETERMINED

## 2011-03-11 LAB — EXPECTORATED SPUTUM ASSESSMENT W GRAM STAIN, RFLX TO RESP C

## 2011-03-11 LAB — AFB CULTURE WITH SMEAR (NOT AT ARMC)

## 2011-03-11 LAB — LEGIONELLA ANTIGEN, URINE

## 2011-03-11 LAB — HIV-1 RNA QUANT-NO REFLEX-BLD
HIV 1 RNA Quant: 313000 copies/mL — ABNORMAL HIGH (ref <50)
HIV-1 RNA Quant, Log: 5.5 — ABNORMAL HIGH (ref <1.70)

## 2011-03-11 LAB — SEDIMENTATION RATE: Sed Rate: 105 — ABNORMAL HIGH

## 2011-03-11 LAB — INFLUENZA A+B VIRUS AG-DIRECT(RAPID)
Inflenza A Ag: NEGATIVE
Influenza B Ag: NEGATIVE

## 2011-03-11 LAB — AFB CULTURE, BLOOD

## 2011-03-11 LAB — HIV-1 GENOTYPR PLUS

## 2011-03-12 LAB — COMPREHENSIVE METABOLIC PANEL
ALT: 250 — ABNORMAL HIGH
ALT: 461 — ABNORMAL HIGH
AST: 238 — ABNORMAL HIGH
Albumin: 2.3 — ABNORMAL LOW
Alkaline Phosphatase: 101
BUN: 15
BUN: 9
CO2: 26
CO2: 26
Calcium: 8.9
Chloride: 95 — ABNORMAL LOW
Chloride: 97
Chloride: 99
Creatinine, Ser: 0.85
Creatinine, Ser: 0.86
GFR calc Af Amer: >60
GFR calc Af Amer: >60
GFR calc non Af Amer: >60
GFR calc non Af Amer: >60
Glucose, Bld: 125 — ABNORMAL HIGH
Potassium: 5.4 — ABNORMAL HIGH
Potassium: 5.4 — ABNORMAL HIGH
Sodium: 130 — ABNORMAL LOW
Sodium: 133 — ABNORMAL LOW
Total Bilirubin: 0.3
Total Bilirubin: 0.3
Total Bilirubin: 0.4

## 2011-03-12 LAB — CBC
HCT: 35.5 — ABNORMAL LOW
HCT: 37.6 — ABNORMAL LOW
Hemoglobin: 12.7 — ABNORMAL LOW
MCHC: 33.7
MCV: 81.2
MCV: 81.9
Platelets: 648 — ABNORMAL HIGH
Platelets: 771 — ABNORMAL HIGH
RBC: 4.33
RBC: 4.61
RBC: 4.66
WBC: 10.5
WBC: 8.5
WBC: 9.1

## 2011-03-12 LAB — CULTURE, BLOOD (ROUTINE X 2)
Culture: NO GROWTH
Culture: NO GROWTH

## 2011-03-12 LAB — BASIC METABOLIC PANEL
BUN: 11
CO2: 24
CO2: 24
CO2: 24
CO2: 26
CO2: 27
Calcium: 8.5
Chloride: 91 — ABNORMAL LOW
Chloride: 95 — ABNORMAL LOW
Chloride: 96
Chloride: 96
Chloride: 98
Creatinine, Ser: 0.94
GFR calc Af Amer: >60
GFR calc Af Amer: >60
GFR calc Af Amer: >60
GFR calc Af Amer: >60
GFR calc Af Amer: >60
GFR calc non Af Amer: >60
Glucose, Bld: 141 — ABNORMAL HIGH
Glucose, Bld: 95
Potassium: 3.6
Potassium: 3.7
Potassium: 4.1
Potassium: 4.5
Sodium: 130 — ABNORMAL LOW
Sodium: 130 — ABNORMAL LOW
Sodium: 131 — ABNORMAL LOW
Sodium: 131 — ABNORMAL LOW
Sodium: 131 — ABNORMAL LOW
Sodium: 131 — ABNORMAL LOW

## 2011-03-12 LAB — BLOOD GAS, ARTERIAL
Acid-base deficit: 0.4
Drawn by: 145321
FIO2: 0.5
O2 Saturation: 94.2
TCO2: 21
pCO2 arterial: 35.2

## 2011-03-12 LAB — CRYPTOCOCCAL ANTIGEN: Crypto Ag: NEGATIVE

## 2011-03-12 LAB — P CARINII SMEAR DFA: Pneumocystis carinii DFA: NEGATIVE

## 2011-03-12 LAB — URINE CULTURE: Colony Count: NO GROWTH

## 2011-03-16 LAB — T-HELPER CELL (CD4) - (RCID CLINIC ONLY): CD4 % Helper T Cell: 2 — ABNORMAL LOW

## 2011-03-18 LAB — T-HELPER CELL (CD4) - (RCID CLINIC ONLY)
CD4 % Helper T Cell: 4 — ABNORMAL LOW
CD4 T Cell Abs: 70 — ABNORMAL LOW

## 2011-04-19 ENCOUNTER — Other Ambulatory Visit: Payer: Self-pay | Admitting: Internal Medicine

## 2011-04-19 ENCOUNTER — Other Ambulatory Visit: Payer: Self-pay | Admitting: *Deleted

## 2011-04-19 DIAGNOSIS — B20 Human immunodeficiency virus [HIV] disease: Secondary | ICD-10-CM

## 2011-04-19 DIAGNOSIS — Z113 Encounter for screening for infections with a predominantly sexual mode of transmission: Secondary | ICD-10-CM

## 2011-04-19 DIAGNOSIS — Z79899 Other long term (current) drug therapy: Secondary | ICD-10-CM

## 2011-04-19 MED ORDER — EFAVIRENZ-EMTRICITAB-TENOFOVIR 600-200-300 MG PO TABS: 1.0000 | ORAL_TABLET | Freq: Every day | ORAL | Status: DC

## 2011-04-19 NOTE — Telephone Encounter (Signed)
Asked pt to make an appt. Told him I will give 30 tabs until seen. Transferred to Vikki Ports to make lab & md appt this month

## 2011-04-22 ENCOUNTER — Other Ambulatory Visit (INDEPENDENT_AMBULATORY_CARE_PROVIDER_SITE_OTHER): Payer: Managed Care, Other (non HMO)

## 2011-04-22 ENCOUNTER — Other Ambulatory Visit: Payer: Self-pay | Admitting: Internal Medicine

## 2011-04-22 DIAGNOSIS — Z79899 Other long term (current) drug therapy: Secondary | ICD-10-CM

## 2011-04-22 DIAGNOSIS — Z113 Encounter for screening for infections with a predominantly sexual mode of transmission: Secondary | ICD-10-CM

## 2011-04-22 DIAGNOSIS — B20 Human immunodeficiency virus [HIV] disease: Secondary | ICD-10-CM

## 2011-04-23 LAB — CBC WITH DIFFERENTIAL/PLATELET
Basophils Absolute: 0 10*3/uL (ref 0.0–0.1)
Basophils Relative: 1 % (ref 0–1)
Eosinophils Absolute: 0 10*3/uL (ref 0.0–0.7)
Eosinophils Relative: 1 % (ref 0–5)
HCT: 40.6 % (ref 39.0–52.0)
Hemoglobin: 13.1 g/dL (ref 13.0–17.0)
Lymphocytes Relative: 52 % — ABNORMAL HIGH (ref 12–46)
Lymphs Abs: 1.9 10*3/uL (ref 0.7–4.0)
MCH: 28.2 pg (ref 26.0–34.0)
MCHC: 32.3 g/dL (ref 30.0–36.0)
MCV: 87.5 fL (ref 78.0–100.0)
Monocytes Absolute: 0.4 10*3/uL (ref 0.1–1.0)
Monocytes Relative: 10 % (ref 3–12)
Neutro Abs: 1.4 10*3/uL — ABNORMAL LOW (ref 1.7–7.7)
Neutrophils Relative %: 37 % — ABNORMAL LOW (ref 43–77)
Platelets: 383 10*3/uL (ref 150–400)
RBC: 4.64 MIL/uL (ref 4.22–5.81)
RDW: 14.1 % (ref 11.5–15.5)
WBC: 3.7 10*3/uL — ABNORMAL LOW (ref 4.0–10.5)

## 2011-04-23 LAB — LIPID PANEL
Cholesterol: 160 mg/dL (ref 0–200)
HDL: 77 mg/dL (ref >39)
LDL Cholesterol: 76 mg/dL (ref 0–99)
Total CHOL/HDL Ratio: 2.1 Ratio
Triglycerides: 34 mg/dL (ref <150)
VLDL: 7 mg/dL (ref 0–40)

## 2011-04-23 LAB — COMPREHENSIVE METABOLIC PANEL
ALT: 25 U/L (ref 0–53)
AST: 23 U/L (ref 0–37)
Albumin: 4 g/dL (ref 3.5–5.2)
Calcium: 9 mg/dL (ref 8.4–10.5)
Chloride: 102 mEq/L (ref 96–112)
Potassium: 4.1 mEq/L (ref 3.5–5.3)

## 2011-04-23 LAB — RPR TITER: RPR Titer: 1:128 {titer} — AB

## 2011-05-06 ENCOUNTER — Encounter: Payer: Self-pay | Admitting: Internal Medicine

## 2011-05-06 ENCOUNTER — Ambulatory Visit (INDEPENDENT_AMBULATORY_CARE_PROVIDER_SITE_OTHER): Payer: Managed Care, Other (non HMO) | Admitting: Internal Medicine

## 2011-05-06 DIAGNOSIS — A539 Syphilis, unspecified: Secondary | ICD-10-CM

## 2011-05-06 DIAGNOSIS — B20 Human immunodeficiency virus [HIV] disease: Secondary | ICD-10-CM

## 2011-05-06 MED ORDER — PENICILLIN G BENZATHINE 2400000 UNIT/4ML IM SUSP: 1200000.0000 [IU] | Freq: Once | INTRAMUSCULAR | Status: DC

## 2011-05-06 MED ORDER — PENICILLIN G BENZATHINE 1200000 UNIT/2ML IM SUSP
1.2000 10*6.[IU] | Freq: Once | INTRAMUSCULAR | Status: AC
  Administered 2011-05-06 (×2): 1.2 10*6.[IU] via INTRAMUSCULAR

## 2011-05-06 MED ORDER — PENICILLIN G BENZATHINE 2400000 UNIT/4ML IM SUSP: 2400000.0000 [IU] | Freq: Once | INTRAMUSCULAR | Status: DC

## 2011-05-06 MED ORDER — EFAVIRENZ-EMTRICITAB-TENOFOVIR 600-200-300 MG PO TABS: 1.0000 | ORAL_TABLET | Freq: Every day | ORAL | Status: DC

## 2011-05-06 MED ORDER — PENICILLIN G BENZATHINE 1200000 UNIT/2ML IM SUSP: 1.2000 10*6.[IU] | Freq: Once | INTRAMUSCULAR | Status: DC

## 2011-05-06 NOTE — Assessment & Plan Note (Signed)
His HIV is under excellent control in his CD4 count has reconstituted to normal. I will continue Atripla.

## 2011-05-06 NOTE — Assessment & Plan Note (Signed)
Unfortunately, he has acquired early syphilis. He is currently asymptomatic. I will treat him today and follow serial lab work. I told him that he needs to keep more frequent visits. I've also reinforced the importance of safer sexual practices. I let him know that he will be contacted by his cannot be health department. His partner needs to be tested as well.

## 2011-05-06 NOTE — Progress Notes (Signed)
  Subjective:    Patient ID: Andrew Alvarez, male    DOB: 05-21-1971, 40 y.o.   MRN: [id number]  HPI Andrew Alvarez is in for his first visit in one year. He states that he is doing well. He recalls missing only 3 doses of his Atripla when his prescription ran out recently and it took a few days to the pharmacy to get it refilled. 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 He has been sexually active with his male partner for about the last 8 months. He states that they always use condoms. His partner is HIV positive and is on therapy and followed in [place]. He does not believe that his partner has had any other recent partners. He has not had any fever, rash or genital lesions.    Review of Systems     Objective:   Physical Exam  Constitutional: He appears well-developed and well-nourished. No distress.  HENT:  Mouth/Throat: Oropharynx is clear and moist. No oropharyngeal exudate.  Cardiovascular: Normal rate, regular rhythm and normal heart sounds.   No murmur heard. Pulmonary/Chest: Breath sounds normal. He has no wheezes. He has no rales.  Genitourinary: Penis normal.  Skin: No rash noted.  Psychiatric: He has a normal mood and affect.   I HIV 1 RNA Quant (copies/mL)  Date Value  04/22/2011 Comment: HIV 1 RNA not detected.   05/12/2010 <20 copies/mL   09/22/2009 <48 copies/mL      CD4 T Cell Abs (cmm)  Date Value  04/22/2011 480   05/12/2010 450   09/22/2009 400    RPR 1:128         Assessment & Plan:

## 2011-05-31 ENCOUNTER — Other Ambulatory Visit: Payer: Self-pay | Admitting: Licensed Clinical Social Worker

## 2011-05-31 DIAGNOSIS — B20 Human immunodeficiency virus [HIV] disease: Secondary | ICD-10-CM

## 2011-05-31 MED ORDER — EFAVIRENZ-EMTRICITAB-TENOFOVIR 600-200-300 MG PO TABS: 1.0000 | ORAL_TABLET | Freq: Every day | ORAL | Status: DC

## 2011-06-11 ENCOUNTER — Other Ambulatory Visit: Payer: Self-pay | Admitting: *Deleted

## 2011-06-11 DIAGNOSIS — B20 Human immunodeficiency virus [HIV] disease: Secondary | ICD-10-CM

## 2011-06-11 MED ORDER — EFAVIRENZ-EMTRICITAB-TENOFOVIR 600-200-300 MG PO TABS: 1.0000 | ORAL_TABLET | Freq: Every day | ORAL | Status: DC

## 2011-07-28 ENCOUNTER — Other Ambulatory Visit: Payer: Managed Care, Other (non HMO)

## 2011-07-28 DIAGNOSIS — B20 Human immunodeficiency virus [HIV] disease: Secondary | ICD-10-CM

## 2011-07-29 ENCOUNTER — Telehealth: Payer: Self-pay

## 2011-07-29 ENCOUNTER — Telehealth: Payer: Self-pay | Admitting: *Deleted

## 2011-07-29 LAB — T-HELPER CELL (CD4) - (RCID CLINIC ONLY)
CD4 % Helper T Cell: 20 % — ABNORMAL LOW (ref 33–55)
CD4 T Cell Abs: 640 uL (ref 400–2700)

## 2011-07-29 LAB — RPR: RPR Ser Ql: REACTIVE — AB

## 2011-07-29 LAB — CBC
HCT: 44.7 % (ref 39.0–52.0)
MCHC: 32.7 g/dL (ref 30.0–36.0)
Platelets: 358 10*3/uL (ref 150–400)
RDW: 13.8 % (ref 11.5–15.5)
WBC: 4.4 10*3/uL (ref 4.0–10.5)

## 2011-07-29 NOTE — Telephone Encounter (Signed)
He states he was aysmptomatic when he got labs. He is a Engineer, civil (consulting) & had no indication of low blood glucose. He had eaten at 6:30am & nothing until he got here for labs. He has been trying to eat better with lots of fruits & vegetables. I encouraged him to keep something to eat with him at all times & to eat at regular intervals. He does not want to go get another lab to check this. He said he might call employee health & see if they would do it. York Spaniel he is going to be back here in 2 weeks & can talk to md then.

## 2011-07-29 NOTE — Telephone Encounter (Signed)
rec'd call from [place] at [place]. critical low glucose of 29. It was repeated & verified. Notified Tomasita Morrow, Charity fundraiser. I called his home number & left a message asking him to call me. I called the work number & they did not know him. No note in chart that md had been notified. I spoke with Dr. Orvan Falconer & told him the lab. States he was on call & no one called him.

## 2011-07-30 LAB — HIV-1 RNA QUANT-NO REFLEX-BLD
HIV 1 RNA Quant: <20 copies/mL (ref <20)
HIV-1 RNA Quant, Log: <1.3 {Log} (ref <1.30)

## 2011-08-03 LAB — LIPID PANEL
Cholesterol: 186 mg/dL (ref 0–200)
HDL: 84 mg/dL (ref >39)
LDL Cholesterol: 94 mg/dL (ref 0–99)
Total CHOL/HDL Ratio: 2.2 Ratio
Triglycerides: 39 mg/dL (ref <150)
VLDL: 8 mg/dL (ref 0–40)

## 2011-08-03 LAB — COMPLETE METABOLIC PANEL WITH GFR
ALT: 20 U/L (ref 0–53)
AST: 21 U/L (ref 0–37)
Albumin: 4.7 g/dL (ref 3.5–5.2)
Alkaline Phosphatase: 67 U/L (ref 39–117)
BUN: 12 mg/dL (ref 6–23)
Calcium: 10 mg/dL (ref 8.4–10.5)
Chloride: 101 mEq/L (ref 96–112)
Potassium: 4.1 mEq/L (ref 3.5–5.3)
Sodium: 139 mEq/L (ref 135–145)
Total Protein: 8.1 g/dL (ref 6.0–8.3)

## 2011-08-11 ENCOUNTER — Encounter: Payer: Self-pay | Admitting: Internal Medicine

## 2011-08-11 ENCOUNTER — Ambulatory Visit (INDEPENDENT_AMBULATORY_CARE_PROVIDER_SITE_OTHER): Payer: Managed Care, Other (non HMO) | Admitting: Internal Medicine

## 2011-08-11 DIAGNOSIS — B20 Human immunodeficiency virus [HIV] disease: Secondary | ICD-10-CM

## 2011-08-11 DIAGNOSIS — A539 Syphilis, unspecified: Secondary | ICD-10-CM

## 2011-08-11 MED ORDER — PENICILLIN G BENZATHINE 1200000 UNIT/2ML IM SUSP: 1.2000 10*6.[IU] | INTRAMUSCULAR | Status: DC

## 2011-08-11 MED ORDER — PENICILLIN G BENZATHINE 1200000 UNIT/2ML IM SUSP
1.2000 10*6.[IU] | INTRAMUSCULAR | Status: AC
  Administered 2011-08-11: 1.2 10*6.[IU] via INTRAMUSCULAR

## 2011-08-11 NOTE — Progress Notes (Signed)
Addended by: Jennet Maduro D on: 08/11/2011 05:48 PM   Modules accepted: Orders

## 2011-08-11 NOTE — Progress Notes (Signed)
Patient ID: Andrew Alvarez, male   DOB: 1970/07/15, 41 y.o.   MRN: [id number]  INFECTIOUS DISEASE PROGRESS NOTE    Subjective: Andrew Alvarez is in for his routine visit. He states that he is doing well and has no problems or concerns since his last visit. He does not recall missing a single dose of his Atripla. 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 He remained sexually active with his monogamous partner but they always use condoms. He states that his partner was tested for syphilis after he was diagnosed last fall and his partner told him his test was negative.  Objective: Temp: 98.5 F (36.9 C) (02/20 1638) Temp src: Oral (02/20 1638) BP: 121/79 mmHg (02/20 1638) Pulse Rate: 83  (02/20 1638)  General: His weight is up 5 pounds. He appears healthy and in no distress Skin: No rash or adenopathy Lungs: Clear Cor: Regular S1 and S2 and no murmurs Abdomen: Soft nontender   Lab Results HIV 1 RNA Quant (copies/mL)  Date Value  07/28/2011 <20   04/22/2011 Comment: HIV 1 RNA not detected.   05/12/2010 <20 copies/mL      CD4 T Cell Abs (cmm)  Date Value  07/28/2011 640   04/22/2011 480   05/12/2010 450      Assessment: His HIV infection remains under excellent control. I will continue Atripla.  His RPR is actually gone up from 128 to 256 after treatment for early syphilis last fall. I suspect that he failed therapy because he actually had late latent syphilis. I will treat him again with benzathine penicillin 2.4 million units IM weekly x3. I talked to him about the extreme importance of safer sexual practices.  Plan: 1. Continue Atripla 2. Treat for late latent syphilis. He will call back tomorrow and let us know if he would like to arrange to have the second and third doses given in [place] by his primary care physician or at the health department. 3. Return to clinic after lab work in 3 months. 4.    Cliffton Asters, MD [facility] for  Infectious Diseases [facility] [phone] pager   [phone] cell 08/11/2011, 5:11 PM

## 2011-08-12 ENCOUNTER — Telehealth: Payer: Self-pay | Admitting: *Deleted

## 2011-08-12 NOTE — Telephone Encounter (Signed)
Wala from his pharmacy wanted to know if they were to mail the injectable PCN to the pt. I checked with Angelique Blonder, RN who stated she had told them yesterday to ignore the order. Sent in error. I called the number I was given to relay this answer & it was not in service. [phone]

## 2011-08-13 ENCOUNTER — Telehealth: Payer: Self-pay | Admitting: *Deleted

## 2011-08-13 NOTE — Telephone Encounter (Signed)
Patient called advised that at his last visit he discussed with the provider he was going to [facility] and would have his PCP there continue his Penicillin therapy. He gave the following information= Dr Sarita Bottom, [phone] fax and [phone] office phone. He advised to let Dr Orvan Falconer know and he will tell us what to do. Told the patient that when he gets in to see the PCP if he has not heard from [place] and they need an order or some records to just sign a release and we will get them what they need to continue his therapy at this is very important.

## 2011-08-16 ENCOUNTER — Encounter: Payer: Self-pay | Admitting: *Deleted

## 2011-08-16 NOTE — Telephone Encounter (Signed)
Please fax my last note to Dr. Lannie Fields.

## 2011-08-16 NOTE — Progress Notes (Signed)
Faxing the last office note to patients PCP, per provider, for continuity of care as he has received his first of three doses of antibiotic and needs them on a regular basis for three weeks straight. Patient gave fax number as [phone] for Dr Shirlee More.

## 2011-08-17 ENCOUNTER — Telehealth: Payer: Self-pay | Admitting: *Deleted

## 2011-08-17 NOTE — Telephone Encounter (Signed)
Pt called telling us that he was told by his pcp that they cannot give the last 2 PCN injections. I called them & had to leave a message for that md's CMA. I notified pt that I do not expect to hear back from them as the message said to expect 24 to 48 hours before they respond. He will go to a local Health dept & sign a release so we can send his info there. They can then have them give him the shots. Georganna Skeans called back from the pcp office. He called & just wanted the 2 shots. He has not been seen since 08/31/10. They told him he would need to be seen & then could give him what was needed. I told her I had instructed him to go to the health dept & sign a release & they could give him the next 2 weeks injections

## 2011-08-18 ENCOUNTER — Telehealth: Payer: Self-pay | Admitting: *Deleted

## 2011-08-18 NOTE — Telephone Encounter (Signed)
The pharmacy called about the rx for the PCN injectable they rec'd. I told her to disregard it as he is getting thru a doctor's office

## 2011-08-19 ENCOUNTER — Telehealth: Payer: Self-pay | Admitting: *Deleted

## 2011-08-19 NOTE — Telephone Encounter (Signed)
He is at the health dept & they are running his labs now to get their own numbers before they give him the PCN shot

## 2011-09-14 ENCOUNTER — Telehealth: Payer: Self-pay | Admitting: *Deleted

## 2011-09-14 DIAGNOSIS — A630 Anogenital (venereal) warts: Secondary | ICD-10-CM

## 2011-09-14 NOTE — Telephone Encounter (Signed)
Will pass request to Dr. Orvan Falconer.  Last prescribed 09/23/2009.  Fax placed in Dr. Blair Dolphin box.

## 2011-09-20 ENCOUNTER — Other Ambulatory Visit: Payer: Self-pay | Admitting: *Deleted

## 2011-09-20 MED ORDER — IMIQUIMOD 5 % EX CREA: TOPICAL_CREAM | CUTANEOUS | Status: DC

## 2011-09-20 NOTE — Telephone Encounter (Signed)
Addended by: Cliffton Asters on: 09/20/2011 10:47 AM   Modules accepted: Orders

## 2011-09-20 NOTE — Telephone Encounter (Signed)
Neysa Bonito with the [facility] pharmacy called [phone] wanting to give the pt #36 packs of aldara instead of 12 at a time to save the co-pay for the pt. I told her that would be ok

## 2012-02-14 ENCOUNTER — Ambulatory Visit: Payer: Managed Care, Other (non HMO) | Admitting: Internal Medicine

## 2012-02-14 ENCOUNTER — Other Ambulatory Visit: Payer: Managed Care, Other (non HMO)

## 2012-02-14 DIAGNOSIS — B20 Human immunodeficiency virus [HIV] disease: Secondary | ICD-10-CM

## 2012-02-15 LAB — RPR TITER: RPR Titer: 1:4 {titer}

## 2012-02-15 LAB — RPR: RPR Ser Ql: REACTIVE — AB

## 2012-02-16 LAB — COMPLETE METABOLIC PANEL WITH GFR
ALT: 26 U/L (ref 0–53)
BUN: 12 mg/dL (ref 6–23)
CO2: 26 mEq/L (ref 19–32)
Calcium: 9 mg/dL (ref 8.4–10.5)
Chloride: 100 mEq/L (ref 96–112)
Creat: 0.86 mg/dL (ref 0.50–1.35)
GFR, Est African American: >89 mL/min

## 2012-02-18 ENCOUNTER — Telehealth: Payer: Self-pay | Admitting: *Deleted

## 2012-02-18 NOTE — Telephone Encounter (Signed)
RN called pt to find out what he wants done with the completed FMLA paperwork.  Asked pt to call office and leave message.  Does he want it mailed to him or faxed to [facility] office at his employer?

## 2012-03-23 ENCOUNTER — Encounter: Payer: Self-pay | Admitting: Internal Medicine

## 2012-03-23 ENCOUNTER — Ambulatory Visit (INDEPENDENT_AMBULATORY_CARE_PROVIDER_SITE_OTHER): Payer: Managed Care, Other (non HMO) | Admitting: Internal Medicine

## 2012-03-23 VITALS — BP 122/75 | HR 90 | Temp 99.0°F | Wt 152.0 lb

## 2012-03-23 DIAGNOSIS — B20 Human immunodeficiency virus [HIV] disease: Secondary | ICD-10-CM

## 2012-03-23 NOTE — Progress Notes (Signed)
Patient ID: Andrew Alvarez, male   DOB: 05-23-1971, 41 y.o.   MRN: [id number]     [facility] for Infectious Disease  Patient Active Problem List  Diagnosis  . 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 HIV DISEASE  . VENEREAL WART  . PNEUMOCYSTIS PNEUMONIA  . ALLERGIC RHINITIS  . ABSCESS, PERIRECTAL  . SHINGLES, HX OF  . Syphilis    Patient's Medications  New Prescriptions   No medications on file  Previous Medications   EFAVIRENZ-EMTRICTABINE-TENOFOVIR (ATRIPLA) 600-200-300 MG PER TABLET    Take 1 tablet by mouth at bedtime.   FLUTICASONE (FLONASE) 50 MCG/ACT NASAL SPRAY    Place 2 sprays into the nose daily.   IMIQUIMOD (ALDARA) 5 % CREAM    Apply topically 3 (three) times a week.   LORATADINE (CLARITIN) 10 MG TABLET    Take 10 mg by mouth daily.    Modified Medications   No medications on file  Discontinued Medications   No medications on file    Subjective: Andrew Alvarez is in for his routine visit. He states that he is doing well the exception of having had a recent bout of sinusitis. He was treated with an antibiotic and started on Flonase for possible concomitant seasonal allergies. He is feeling better. He denies missing any doses of his Atripla. After his last visit here in February he completed 3 doses of benzathine penicillin, receiving the last 2 in [place] after he was found to have early syphilis. He and his former partner broke off their relationship. He is in a new relationship with a male partner who is aware of his HIV infection. His new partner is HIV negative. They have been sexually active but they always use condoms.he will be taking a new job at the [employer] in December. He will be working three 12 hour shifts each week and will not have to rotate day and night shifts. He received his influenza vaccine yesterday.  Objective: Temp: 99 F (37.2 C) (10/03 1544) Temp src: Oral (10/03 1544) BP: 122/75 mmHg (10/03 1544) Pulse Rate:  90  (10/03 1544)  General: he is in good spirits Skin: no rash or palpable adenopathy Lungs: clear Cor: regular S1 and S2 no murmurs  Lab Results HIV 1 RNA Quant (copies/mL)  Date Value  02/14/2012 <20   07/28/2011 <20   04/22/2011 Comment: HIV 1 RNA not detected.      CD4 T Cell Abs (cmm)  Date Value  02/14/2012 420   07/28/2011 640   04/22/2011 480      Assessment: His HIV infection remains under excellent control. His RPR has decreased from 1:256 in February to 1:4 now. This indicates a good response to penicillin therapy.  Plan: 1. Continue Atripla 2. Counseled to follow safer sexual practices 3. Follow up here after lab work in 6 months   Cliffton Asters, MD [facility] for Infectious Disease [facility] [phone] pager   [phone] cell 03/23/2012, 4:14 PM

## 2012-05-16 ENCOUNTER — Other Ambulatory Visit: Payer: Self-pay | Admitting: *Deleted

## 2012-05-16 DIAGNOSIS — B20 Human immunodeficiency virus [HIV] disease: Secondary | ICD-10-CM

## 2012-05-16 MED ORDER — EFAVIRENZ-EMTRICITAB-TENOFOVIR 600-200-300 MG PO TABS: 1.0000 | ORAL_TABLET | Freq: Every day | ORAL | Status: DC

## 2012-08-03 ENCOUNTER — Other Ambulatory Visit: Payer: Self-pay | Admitting: Internal Medicine

## 2012-12-12 ENCOUNTER — Other Ambulatory Visit: Payer: Self-pay | Admitting: Internal Medicine

## 2012-12-14 ENCOUNTER — Other Ambulatory Visit: Payer: Managed Care, Other (non HMO)

## 2012-12-14 DIAGNOSIS — B20 Human immunodeficiency virus [HIV] disease: Secondary | ICD-10-CM

## 2012-12-14 LAB — CBC
HCT: 43.1 % (ref 39.0–52.0)
Hemoglobin: 14.7 g/dL (ref 13.0–17.0)
MCH: 28.5 pg (ref 26.0–34.0)
MCHC: 34.1 g/dL (ref 30.0–36.0)
MCV: 83.5 fL (ref 78.0–100.0)
RBC: 5.16 MIL/uL (ref 4.22–5.81)

## 2012-12-15 LAB — LIPID PANEL
Cholesterol: 158 mg/dL (ref 0–200)
HDL: 79 mg/dL (ref >39)
LDL Cholesterol: 69 mg/dL (ref 0–99)
Triglycerides: 49 mg/dL (ref <150)
VLDL: 10 mg/dL (ref 0–40)

## 2012-12-15 LAB — COMPREHENSIVE METABOLIC PANEL
Alkaline Phosphatase: 71 U/L (ref 39–117)
BUN: 8 mg/dL (ref 6–23)
CO2: 30 mEq/L (ref 19–32)
Creat: 0.83 mg/dL (ref 0.50–1.35)
Glucose, Bld: 64 mg/dL — ABNORMAL LOW (ref 70–99)
Total Bilirubin: 0.4 mg/dL (ref 0.3–1.2)

## 2012-12-15 LAB — RPR TITER: RPR Titer: 1:16 {titer} — AB

## 2012-12-15 LAB — T.PALLIDUM AB, TOTAL: T pallidum Antibodies (TP-PA): >8 S/CO — ABNORMAL HIGH (ref <0.90)

## 2012-12-17 LAB — HIV-1 RNA QUANT-NO REFLEX-BLD: HIV 1 RNA Quant: <20 copies/mL (ref <20)

## 2012-12-19 ENCOUNTER — Telehealth: Payer: Self-pay | Admitting: *Deleted

## 2012-12-19 NOTE — Telephone Encounter (Signed)
Patient given appointment 12/26/12 at 8:45 with you.  Pt notified, verbalized agreement.

## 2012-12-19 NOTE — Telephone Encounter (Signed)
Yes, he will need reassessmnet and retreatment. Please schedule him to see me my first available appointment.

## 2012-12-19 NOTE — Telephone Encounter (Signed)
Denyse Amass from [facility] called re: rise in RPR titer.  He would like to know if the positive result and treatment is something the state needs to track or not. Please advise. Andree Coss, RN

## 2012-12-26 ENCOUNTER — Ambulatory Visit (INDEPENDENT_AMBULATORY_CARE_PROVIDER_SITE_OTHER): Payer: Managed Care, Other (non HMO) | Admitting: Internal Medicine

## 2012-12-26 ENCOUNTER — Encounter: Payer: Self-pay | Admitting: Internal Medicine

## 2012-12-26 VITALS — BP 137/83 | HR 86 | Temp 98.7°F | Ht 69.0 in | Wt 144.5 lb

## 2012-12-26 DIAGNOSIS — B20 Human immunodeficiency virus [HIV] disease: Secondary | ICD-10-CM

## 2012-12-26 DIAGNOSIS — A539 Syphilis, unspecified: Secondary | ICD-10-CM

## 2012-12-26 MED ORDER — PENICILLIN G BENZATHINE 1200000 UNIT/2ML IM SUSP
1.2000 10*6.[IU] | Freq: Once | INTRAMUSCULAR | Status: AC
  Administered 2012-12-26: 1.2 10*6.[IU] via INTRAMUSCULAR

## 2012-12-26 NOTE — Progress Notes (Signed)
Patient ID: Andrew Alvarez, male   DOB: May 04, 1971, 42 y.o.   MRN: [id number]          [facility] for Infectious Disease  Patient Active Problem List   Diagnosis Date Noted  . Syphilis 05/06/2011  . ABSCESS, PERIRECTAL 10/14/2009  . ALLERGIC RHINITIS 10/10/2007  . 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 HIV DISEASE 08/15/2007  . VENEREAL WART 08/15/2007  . PNEUMOCYSTIS PNEUMONIA 08/15/2007  . SHINGLES, HX OF 08/15/2007    Patient's Medications  New Prescriptions   No medications on file  Previous Medications   ATRIPLA 600-200-300 MG PER TABLET    Take 1 tablet by mouth at bedtime.   FLUTICASONE (FLONASE) 50 MCG/ACT NASAL SPRAY    Place 2 sprays into the nose daily.   IMIQUIMOD (ALDARA) 5 % CREAM    APPLY TOPICALLY 3 TIMES A WEEK   LORATADINE (CLARITIN) 10 MG TABLET    Take 10 mg by mouth daily.    Modified Medications   No medications on file  Discontinued Medications   No medications on file    Subjective: Andrew Alvarez is in for his routine visit. He denies missing any doses of his Atripla since his last visit. He continues to use alcohol or cream but still has small perianal warts. He continues to use his Flonase on a regular basis for seasonal allergies. He had been in a short-term relationship with a male partner and says that they always use condoms. He is no longer in a relationship.  Review of Systems: Constitutional: negative Eyes: negative Ears, nose, mouth, throat, and face: positive for intermittent sneezing and rhinorrhea, negative for nasal congestion, sore mouth and sore throat Respiratory: negative Cardiovascular: negative Gastrointestinal: negative Genitourinary:negative  Past Medical History  Diagnosis Date  . Allergy   . HIV infection     History  Substance Use Topics  . Smoking status: Never Smoker   . Smokeless tobacco: Never Used  . Alcohol Use: 0.5 oz/week    1 drink(s) per week    Family History  Problem Relation Age of  Onset  . Hypertension Mother     No Known Allergies  Objective: Temp: 98.7 F (37.1 C) (07/08 0835) Temp src: Oral (07/08 0835) BP: 137/83 mmHg (07/08 0835) Pulse Rate: 86 (07/08 0835)  General: He is healthy and in good spirits Oral: No oropharyngeal lesions Skin: Chronic stable hyperpigmented lesion on left ankle Lungs: Clear Cor: Regular S1 and S2 no murmurs Abdomen: Non-tender Joints and extremities: Normal Neuro: Alert and oriented with normal speech Mood and affect: Normal  Lab Results HIV 1 RNA Quant (copies/mL)  Date Value  12/14/2012 <20   02/14/2012 <20   07/28/2011 <20      CD4 T Cell Abs (cmm)  Date Value  12/14/2012 560   02/14/2012 420   07/28/2011 640      Lab Results  Component Value Date   WBC 3.6* 12/14/2012   HGB 14.7 12/14/2012   HCT 43.1 12/14/2012   MCV 83.5 12/14/2012   PLT 388 12/14/2012   BMET    Component Value Date/Time   NA 138 12/14/2012 1610   K 3.8 12/14/2012 1610   CL 101 12/14/2012 1610   CO2 30 12/14/2012 1610   GLUCOSE 64* 12/14/2012 1610   BUN 8 12/14/2012 1610   CREATININE 0.83 12/14/2012 1610   CREATININE 0.80 05/12/2010 2022   CALCIUM 9.6 12/14/2012 1610   GFRNONAA >60 08/02/2007 0355   GFRAA  Value: >60  The eGFR has been calculated using the MDRD equation. This calculation has not been validated in all clinical 08/02/2007 0355   Lab Results  Component Value Date   ALT 24 12/14/2012   AST 17 12/14/2012   ALKPHOS 71 12/14/2012   BILITOT 0.4 12/14/2012   Lab Results  Component Value Date   CHOL 158 12/14/2012   HDL 79 12/14/2012   LDLCALC 69 12/14/2012   TRIG 49 12/14/2012   CHOLHDL 2.0 12/14/2012   RPR 1:16 Assessment: His HIV infection remains under excellent control. I will continue Atripla.  His RPR has risen from 1:4 to 1:16 suggesting reinfection or failed therapy last year. I will retreat for late latent syphilis.  He will consider general surgery referral for persistent perianal warts.  Plan: 1. Continue  Atripla 2. Benzathine penicillin 2.4 million units IM weekly x3. He will get his first dose here today and try to arrange the second and third doses with his primary care physician, Dr. Daryll Drown, in [place].  3. Follow up after blood work in 6 months   Cliffton Asters, MD [facility] for Infectious Disease [facility] [phone] pager   [phone] cell 12/26/2012, 9:00 AM

## 2013-04-09 ENCOUNTER — Other Ambulatory Visit: Payer: Self-pay | Admitting: Internal Medicine

## 2013-04-09 DIAGNOSIS — B20 Human immunodeficiency virus [HIV] disease: Secondary | ICD-10-CM

## 2013-05-22 ENCOUNTER — Other Ambulatory Visit: Payer: Managed Care, Other (non HMO)

## 2013-05-25 ENCOUNTER — Other Ambulatory Visit: Payer: Managed Care, Other (non HMO)

## 2013-05-25 DIAGNOSIS — B20 Human immunodeficiency virus [HIV] disease: Secondary | ICD-10-CM

## 2013-05-25 LAB — COMPREHENSIVE METABOLIC PANEL
ALT: 17 U/L (ref 0–53)
AST: 16 U/L (ref 0–37)
Alkaline Phosphatase: 81 U/L (ref 39–117)
BUN: 7 mg/dL (ref 6–23)
Calcium: 9.4 mg/dL (ref 8.4–10.5)
Chloride: 102 mEq/L (ref 96–112)
Creat: 0.76 mg/dL (ref 0.50–1.35)
Potassium: 4 mEq/L (ref 3.5–5.3)

## 2013-05-25 LAB — CBC
MCHC: 34.1 g/dL (ref 30.0–36.0)
MCV: 83.7 fL (ref 78.0–100.0)
Platelets: 402 10*3/uL — ABNORMAL HIGH (ref 150–400)
RDW: 13.8 % (ref 11.5–15.5)
WBC: 4.1 10*3/uL (ref 4.0–10.5)

## 2013-05-25 LAB — LIPID PANEL
HDL: 72 mg/dL (ref >39)
LDL Cholesterol: 68 mg/dL (ref 0–99)
Total CHOL/HDL Ratio: 2.1 Ratio

## 2013-05-25 LAB — T-HELPER CELL (CD4) - (RCID CLINIC ONLY)
CD4 % Helper T Cell: 22 % — ABNORMAL LOW (ref 33–55)
CD4 T Cell Abs: 430 /uL (ref 400–2700)

## 2013-05-26 LAB — RPR: RPR Ser Ql: REACTIVE — AB

## 2013-05-26 LAB — RPR TITER: RPR Titer: 1:16 {titer} — AB

## 2013-05-31 LAB — HIV-1 RNA QUANT-NO REFLEX-BLD: HIV-1 RNA Quant, Log: <1.3 {Log} (ref <1.30)

## 2013-06-12 ENCOUNTER — Encounter: Payer: Self-pay | Admitting: Internal Medicine

## 2013-06-12 ENCOUNTER — Ambulatory Visit (INDEPENDENT_AMBULATORY_CARE_PROVIDER_SITE_OTHER): Payer: Managed Care, Other (non HMO) | Admitting: Internal Medicine

## 2013-06-12 VITALS — BP 109/72 | HR 73 | Temp 98.2°F | Ht 69.0 in | Wt 141.5 lb

## 2013-06-12 DIAGNOSIS — Z23 Encounter for immunization: Secondary | ICD-10-CM

## 2013-06-12 DIAGNOSIS — B20 Human immunodeficiency virus [HIV] disease: Secondary | ICD-10-CM

## 2013-06-12 NOTE — Progress Notes (Signed)
Patient ID: Andrew Alvarez, male   DOB: 05/21/1971, 42 y.o.   MRN: [id number]          [facility] for Infectious Disease  Patient Active Problem List   Diagnosis Date Noted  . Syphilis 05/06/2011  . ABSCESS, PERIRECTAL 10/14/2009  . ALLERGIC RHINITIS 10/10/2007  . 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 HIV DISEASE 08/15/2007  . VENEREAL WART 08/15/2007  . PNEUMOCYSTIS PNEUMONIA 08/15/2007  . SHINGLES, HX OF 08/15/2007    Patient's Medications  New Prescriptions   No medications on file  Previous Medications   ATRIPLA 600-200-300 MG PER TABLET    Take 1 tablet by mouth at bedtime.   FLUTICASONE (FLONASE) 50 MCG/ACT NASAL SPRAY    Place 2 sprays into the nose daily.   IMIQUIMOD (ALDARA) 5 % CREAM    APPLY TOPICALLY 3 TIMES A WEEK   LORATADINE (CLARITIN) 10 MG TABLET    Take 10 mg by mouth daily.    Modified Medications   No medications on file  Discontinued Medications   No medications on file    Subjective: Andrew Alvarez is in for his routine visit. As usual, he never misses a single dose of his Atripla. He is doing well without complaints. He did complete all 3 doses of his benzathine penicillin, receiving her second and third dose in [place] after his visit with me in July. He remains sexually active with the same male partner who is also treated for syphilis.  He has been using his Aldara cream but notes that his perirectal warts have not changed much.  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  Review of Systems: Constitutional: negative Eyes: negative Ears, nose, mouth, throat, and face: negative Respiratory: negative Cardiovascular: negative Gastrointestinal: negative Genitourinary:negative  Past Medical History  Diagnosis Date  . Allergy   . HIV infection     History  Substance Use Topics  . Smoking status: Never Smoker   . Smokeless tobacco: Never Used  . Alcohol Use: 0.5 oz/week    1 drink(s)  per week    Family History  Problem Relation Age of Onset  . Hypertension Mother     No Known Allergies  Objective:    General: He is in no distress Oral: No oropharyngeal lesions her teeth in good condition Skin: No rash Lungs: Clear Cor: Regular S1 and S2 no murmurs Abdomen: Nontender Joints and extremities: Normal Neuro: Alert with normal speech and conversation Mood and affect: Normal  Lab Results Lab Results  Component Value Date   WBC 4.1 05/25/2013   HGB 14.0 05/25/2013   HCT 41.0 05/25/2013   MCV 83.7 05/25/2013   PLT 402* 05/25/2013    Lab Results  Component Value Date   CREATININE 0.76 05/25/2013   BUN 7 05/25/2013   NA 140 05/25/2013   K 4.0 05/25/2013   CL 102 05/25/2013   CO2 29 05/25/2013    Lab Results  Component Value Date   ALT 17 05/25/2013   AST 16 05/25/2013   ALKPHOS 81 05/25/2013   BILITOT 0.3 05/25/2013    Lab Results  Component Value Date   CHOL 151 05/25/2013   HDL 72 05/25/2013   LDLCALC 68 05/25/2013   TRIG 54 05/25/2013   CHOLHDL 2.1 05/25/2013    Lab Results HIV 1 RNA Quant (copies/mL)  Date Value  05/25/2013 <20   12/14/2012 <20   02/14/2012 <20      CD4 T Cell Abs (/  uL)  Date Value  05/25/2013 430   12/14/2012 560   02/14/2012 420      Lab Results  Component Value Date   WBC 4.1 05/25/2013   HGB 14.0 05/25/2013   HCT 41.0 05/25/2013   MCV 83.7 05/25/2013   PLT 402* 05/25/2013   BMET    Component Value Date/Time   NA 140 05/25/2013 1135   K 4.0 05/25/2013 1135   CL 102 05/25/2013 1135   CO2 29 05/25/2013 1135   GLUCOSE 73 05/25/2013 1135   BUN 7 05/25/2013 1135   CREATININE 0.76 05/25/2013 1135   CREATININE 0.80 05/12/2010 2022   CALCIUM 9.4 05/25/2013 1135   GFRNONAA >60 08/02/2007 0355   GFRAA  Value: >60        The eGFR has been calculated using the MDRD equation. This calculation has not been validated in all clinical 08/02/2007 0355   Lab Results  Component Value Date   ALT 17 05/25/2013   AST 16 05/25/2013   ALKPHOS 81  05/25/2013   BILITOT 0.3 05/25/2013   Lab Results  Component Value Date   CHOL 151 05/25/2013   HDL 72 05/25/2013   LDLCALC 68 05/25/2013   TRIG 54 05/25/2013   CHOLHDL 2.1 05/25/2013   RPR 05/25/2013: 1:16 Assessment: His HIV infection remains under excellent control. Will continue Atripla.  His RPR remains elevated after recent treatment for late, latent syphilis. Syphilis serologies can be misleading if HIV co-infected individuals. I will continue to monitor his RPR but will not retreat him at this time.  I would suggest referral for general surgery evaluation of his perirectal warts. Might benefit from high resolution endoscopy to rule out in the atypia or neoplasia. He would like to have that referral completed in [place].  Over the last 2 years he has had some periods with mild hypoglycemia and one episode of hyperglycemia but his blood sugar is normal now.  Plan: 1. Continue Atripla 2. Monitor RPR. I talked to him about careful partner selection and condom use to prevent acquisition of other strains of HIV and other STDs 3. Recommend General surgery referral in [place] 4. Follow up here after blood work in 6 months   Cliffton Asters, MD [facility] for Infectious Disease [facility] [phone] pager   [phone] cell 06/12/2013, 11:24 AM

## 2013-10-07 ENCOUNTER — Other Ambulatory Visit: Payer: Self-pay | Admitting: Internal Medicine

## 2013-10-07 DIAGNOSIS — B20 Human immunodeficiency virus [HIV] disease: Secondary | ICD-10-CM

## 2014-04-02 ENCOUNTER — Other Ambulatory Visit: Payer: Self-pay | Admitting: Internal Medicine

## 2014-04-02 ENCOUNTER — Other Ambulatory Visit: Payer: Self-pay | Admitting: Licensed Clinical Social Worker

## 2014-04-02 MED ORDER — EFAVIRENZ-EMTRICITAB-TENOFOVIR 600-200-300 MG PO TABS: ORAL_TABLET | ORAL | Status: DC

## 2014-04-08 ENCOUNTER — Other Ambulatory Visit: Payer: Self-pay | Admitting: *Deleted

## 2014-04-08 DIAGNOSIS — B20 Human immunodeficiency virus [HIV] disease: Secondary | ICD-10-CM

## 2014-04-08 MED ORDER — EFAVIRENZ-EMTRICITAB-TENOFOVIR 600-200-300 MG PO TABS: ORAL_TABLET | ORAL | Status: DC

## 2014-04-24 ENCOUNTER — Ambulatory Visit (INDEPENDENT_AMBULATORY_CARE_PROVIDER_SITE_OTHER): Payer: Managed Care, Other (non HMO) | Admitting: Internal Medicine

## 2014-04-24 ENCOUNTER — Encounter: Payer: Self-pay | Admitting: Internal Medicine

## 2014-04-24 VITALS — BP 115/76 | HR 87 | Temp 98.2°F | Wt 150.0 lb

## 2014-04-24 DIAGNOSIS — A630 Anogenital (venereal) warts: Secondary | ICD-10-CM

## 2014-04-24 DIAGNOSIS — Z79899 Other long term (current) drug therapy: Secondary | ICD-10-CM

## 2014-04-24 DIAGNOSIS — Z113 Encounter for screening for infections with a predominantly sexual mode of transmission: Secondary | ICD-10-CM

## 2014-04-24 DIAGNOSIS — B20 Human immunodeficiency virus [HIV] disease: Secondary | ICD-10-CM

## 2014-04-24 LAB — CBC
HEMATOCRIT: 41.7 % (ref 39.0–52.0)
HEMOGLOBIN: 14.4 g/dL (ref 13.0–17.0)
MCH: 29 pg (ref 26.0–34.0)
MCHC: 34.5 g/dL (ref 30.0–36.0)
MCV: 83.9 fL (ref 78.0–100.0)
Platelets: 374 10*3/uL (ref 150–400)
RBC: 4.97 MIL/uL (ref 4.22–5.81)
RDW: 13.9 % (ref 11.5–15.5)
WBC: 3.8 10*3/uL — ABNORMAL LOW (ref 4.0–10.5)

## 2014-04-24 LAB — COMPREHENSIVE METABOLIC PANEL
ALBUMIN: 4.3 g/dL (ref 3.5–5.2)
ALK PHOS: 63 U/L (ref 39–117)
ALT: 22 U/L (ref 0–53)
AST: 18 U/L (ref 0–37)
BUN: 10 mg/dL (ref 6–23)
CO2: 29 meq/L (ref 19–32)
Calcium: 8.9 mg/dL (ref 8.4–10.5)
Chloride: 103 mEq/L (ref 96–112)
Creat: 0.81 mg/dL (ref 0.50–1.35)
GLUCOSE: 85 mg/dL (ref 70–99)
Potassium: 3.8 mEq/L (ref 3.5–5.3)
SODIUM: 140 meq/L (ref 135–145)
TOTAL PROTEIN: 7 g/dL (ref 6.0–8.3)
Total Bilirubin: 0.2 mg/dL (ref 0.2–1.2)

## 2014-04-24 LAB — LIPID PANEL
CHOLESTEROL: 164 mg/dL (ref 0–200)
HDL: 90 mg/dL (ref >39)
LDL Cholesterol: 65 mg/dL (ref 0–99)
TRIGLYCERIDES: 43 mg/dL (ref <150)
Total CHOL/HDL Ratio: 1.8 Ratio
VLDL: 9 mg/dL (ref 0–40)

## 2014-04-24 MED ORDER — EFAVIRENZ-EMTRICITAB-TENOFOVIR 600-200-300 MG PO TABS: ORAL_TABLET | ORAL | Status: DC

## 2014-04-24 MED ORDER — IMIQUIMOD 5 % EX CREA: TOPICAL_CREAM | CUTANEOUS | Status: DC

## 2014-04-24 NOTE — Progress Notes (Addendum)
Patient ID: Andrew Alvarez, male   DOB: 11/24/1970, 43 y.o.   MRN: [id number]          Patient Active Problem List   Diagnosis Date Noted  . Syphilis 05/06/2011  . ABSCESS, PERIRECTAL 10/14/2009  . ALLERGIC RHINITIS 10/10/2007  . 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 Human immunodeficiency virus (HIV) disease 08/15/2007  . VENEREAL WART 08/15/2007  . PNEUMOCYSTIS PNEUMONIA 08/15/2007  . SHINGLES, HX OF 08/15/2007    Patient's Medications  New Prescriptions   No medications on file  Previous Medications   FLUTICASONE (FLONASE) 50 MCG/ACT NASAL SPRAY    Place 2 sprays into the nose daily.   LORATADINE (CLARITIN) 10 MG TABLET    Take 10 mg by mouth daily.    Modified Medications   Modified Medication Previous Medication   EFAVIRENZ-EMTRICITABINE-TENOFOVIR (ATRIPLA) 600-200-300 MG PER TABLET efavirenz-emtricitabine-tenofovir (ATRIPLA) 600-200-300 MG per tablet      Take 1 tablet by mouth at bedtime.    Take 1 tablet by mouth at bedtime.   IMIQUIMOD (ALDARA) 5 % CREAM imiquimod (ALDARA) 5 % cream      APPLY TOPICALLY 3 TIMES A WEEK    APPLY TOPICALLY 3 TIMES A WEEK  Discontinued Medications   No medications on file    Subjective: Andrew Alvarez is in for his first visit since December of last year. He states that he is doing well. 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 As usual, he never misses a single dose of his Atripla. He has not noted any change in his perirectal warts. He has been out of his imiquimod cream. At the time of his last visit we talked about trying to obtain a general surgery referral in [place] but that was never arranged. He tells me that his primary care physician has left that practice and he has been assigned to another provider that he has not met yet. He did complete therapy for syphilis late last year.  Review of Systems: Pertinent items are noted in HPI.  Past Medical  History  Diagnosis Date  . Allergy   . HIV infection     History  Substance Use Topics  . Smoking status: Never Smoker   . Smokeless tobacco: Never Used  . Alcohol Use: 0.5 oz/week    1 drink(s) per week    Family History  Problem Relation Age of Onset  . Hypertension Mother     No Known Allergies  Objective: Temp: 98.2 F (36.8 C) (11/04 1506) Temp Source: Oral (11/04 1506) BP: 115/76 mmHg (11/04 1506) Pulse Rate: 87 (11/04 1506) Body mass index is 22.14 kg/(m^2).  General: He is in good spirits and in no distress Oral: no oropharyngeal lesions Skin: no rash Lungs: clear Cor: regular S1 and S2 with no murmur  Lab Results Lab Results  Component Value Date   WBC 4.1 05/25/2013   HGB 14.0 05/25/2013   HCT 41.0 05/25/2013   MCV 83.7 05/25/2013   PLT 402* 05/25/2013    Lab Results  Component Value Date   CREATININE 0.76 05/25/2013   BUN 7 05/25/2013   NA 140 05/25/2013   K 4.0 05/25/2013   CL 102 05/25/2013   CO2 29 05/25/2013    Lab Results  Component Value Date   ALT 17 05/25/2013   AST 16 05/25/2013   ALKPHOS 81 05/25/2013   BILITOT 0.3 05/25/2013    Lab Results  Component Value  Date   CHOL 151 05/25/2013   HDL 72 05/25/2013   LDLCALC 68 05/25/2013   TRIG 54 05/25/2013   CHOLHDL 2.1 05/25/2013    Lab Results HIV 1 RNA QUANT (copies/mL)  Date Value  05/25/2013 <20  12/14/2012 <20  02/14/2012 <20   CD4 T CELL ABS  Date Value  05/25/2013 430 /uL  12/14/2012 560 cmm  02/14/2012 420 cmm     Assessment: I will continue Atripla and repeat lab work today.  He will need a follow-up RPR to confirm appropriate therapy for syphilis.  I do recommend a general surgery referral for evaluation of his genital warts and for evaluation to look for any evidence of precancerous or cancerous lesions.  Plan: 1. Continue Atripla 2. Lab work today 3. Refill imiquimod cream 4. Recommend general surgery referral in [place] 5. Follow-up in 6  months   Michel Bickers, MD [facility] for Infectious [place] [phone] pager   [phone] cell 04/24/2014, 3:32 PM  Addendum:  His RPR remains positive at a titer of 1:128. We will call him and let him know that he needs repeat treatment with benzathine penicillin 2.4 million units IM weekly 3. He should go to the health department or his primary care physician in [place].  Michel Bickers, MD [facility] for Infectious [place] [facility] [phone] pager   [phone] cell 05/01/2014, 5:22 PM

## 2014-04-25 LAB — FLUORESCENT TREPONEMAL AB(FTA)-IGG-BLD: FLUORESCENT TREPONEMAL ABS: REACTIVE — AB

## 2014-04-25 LAB — T-HELPER CELL (CD4) - (RCID CLINIC ONLY)
CD4 % Helper T Cell: 27 % — ABNORMAL LOW (ref 33–55)
CD4 T Cell Abs: 470 /uL (ref 400–2700)

## 2014-04-25 LAB — RPR: RPR: REACTIVE — AB

## 2014-04-25 LAB — RPR TITER: RPR Titer: 1:128 {titer} — AB

## 2014-04-27 LAB — HIV-1 RNA QUANT-NO REFLEX-BLD: HIV-1 RNA Quant, Log: <1.3 {Log} (ref <1.30)

## 2014-05-02 NOTE — Patient Instructions (Addendum)
Phone call to the pt.  Per Dr. Orvan Falconerampbell "His RPR remains positive at a titer of 1:128. We will call him and let him know that he needs repeat treatment with benzathine penicillin 2.4 million units IM weekly 3. He should go to the health department or his primary care physician in [place]."  RN shared Dr. Blair Dolphinampbell's note and RPR results.  Pt verbalized understanding and stated that he would go for treatment.

## 2014-11-25 ENCOUNTER — Ambulatory Visit: Payer: Managed Care, Other (non HMO) | Admitting: Internal Medicine

## 2015-01-08 ENCOUNTER — Encounter: Payer: Self-pay | Admitting: Internal Medicine

## 2015-01-08 ENCOUNTER — Ambulatory Visit (INDEPENDENT_AMBULATORY_CARE_PROVIDER_SITE_OTHER): Payer: Managed Care, Other (non HMO) | Admitting: Internal Medicine

## 2015-01-08 VITALS — BP 117/76 | HR 77 | Temp 98.5°F | Wt 146.8 lb

## 2015-01-08 DIAGNOSIS — J302 Other seasonal allergic rhinitis: Secondary | ICD-10-CM | POA: Diagnosis not present

## 2015-01-08 DIAGNOSIS — B20 Human immunodeficiency virus [HIV] disease: Secondary | ICD-10-CM

## 2015-01-08 DIAGNOSIS — Z23 Encounter for immunization: Secondary | ICD-10-CM

## 2015-01-08 LAB — COMPREHENSIVE METABOLIC PANEL
ALBUMIN: 4.3 g/dL (ref 3.5–5.2)
ALT: 18 U/L (ref 0–53)
AST: 18 U/L (ref 0–37)
Alkaline Phosphatase: 60 U/L (ref 39–117)
BILIRUBIN TOTAL: 0.3 mg/dL (ref 0.2–1.2)
BUN: 8 mg/dL (ref 6–23)
CHLORIDE: 101 meq/L (ref 96–112)
CO2: 28 mEq/L (ref 19–32)
CREATININE: 0.77 mg/dL (ref 0.50–1.35)
Calcium: 9.6 mg/dL (ref 8.4–10.5)
Glucose, Bld: 75 mg/dL (ref 70–99)
Potassium: 4.6 mEq/L (ref 3.5–5.3)
Sodium: 141 mEq/L (ref 135–145)
TOTAL PROTEIN: 7.8 g/dL (ref 6.0–8.3)

## 2015-01-08 LAB — LIPID PANEL
CHOLESTEROL: 179 mg/dL (ref 0–200)
HDL: 106 mg/dL (ref >=40)
LDL Cholesterol: 64 mg/dL (ref 0–99)
Total CHOL/HDL Ratio: 1.7 Ratio
Triglycerides: 47 mg/dL (ref <150)
VLDL: 9 mg/dL (ref 0–40)

## 2015-01-08 LAB — RPR TITER

## 2015-01-08 LAB — CBC
HCT: 45.1 % (ref 39.0–52.0)
HEMOGLOBIN: 15.3 g/dL (ref 13.0–17.0)
MCH: 29.2 pg (ref 26.0–34.0)
MCHC: 33.9 g/dL (ref 30.0–36.0)
MCV: 86.1 fL (ref 78.0–100.0)
MPV: 8.2 fL — AB (ref 8.6–12.4)
Platelets: 361 10*3/uL (ref 150–400)
RBC: 5.24 MIL/uL (ref 4.22–5.81)
RDW: 13.5 % (ref 11.5–15.5)
WBC: 3.4 10*3/uL — AB (ref 4.0–10.5)

## 2015-01-08 LAB — RPR: RPR: REACTIVE — AB

## 2015-01-08 MED ORDER — ELVITEG-COBIC-EMTRICIT-TENOFAF 150-150-200-10 MG PO TABS: 1.0000 | ORAL_TABLET | Freq: Every day | ORAL | Status: DC

## 2015-01-08 MED ORDER — BECLOMETHASONE DIPROP MONOHYD 42 MCG/SPRAY NA SUSP: 2.0000 | Freq: Two times a day (BID) | NASAL | Status: DC

## 2015-01-08 NOTE — Progress Notes (Signed)
Patient ID: Andrew Alvarez, male   DOB: March 23, 1971, 44 y.o.   MRN: [id number]          Patient Active Problem List   Diagnosis Date Noted  . Human immunodeficiency virus (HIV) disease 08/15/2007    Priority: High  . Syphilis 05/06/2011  . ABSCESS, PERIRECTAL 10/14/2009  . Allergic rhinitis 10/10/2007  . 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 VENEREAL WART 08/15/2007  . PNEUMOCYSTIS PNEUMONIA 08/15/2007  . SHINGLES, HX OF 08/15/2007    Patient's Medications  New Prescriptions   BECLOMETHASONE (BECONASE-AQ) 42 MCG/SPRAY NASAL SPRAY    Place 2 sprays into both nostrils 2 (two) times daily. Dose is for each nostril.   ELVITEGRAVIR-COBICISTAT-EMTRICITABINE-TENOFOVIR (GENVOYA) 150-150-200-10 MG TABS TABLET    Take 1 tablet by mouth daily with breakfast.  Previous Medications   IMIQUIMOD (ALDARA) 5 % CREAM    APPLY TOPICALLY 3 TIMES A WEEK   LORATADINE (CLARITIN) 10 MG TABLET    Take 10 mg by mouth daily.    Modified Medications   No medications on file  Discontinued Medications   EFAVIRENZ-EMTRICITABINE-TENOFOVIR (ATRIPLA) 600-200-300 MG PER TABLET    Take 1 tablet by mouth at bedtime.   FLUTICASONE (FLONASE) 50 MCG/ACT NASAL SPRAY    Place 2 sprays into the nose daily.    Subjective: [name] is in for his routine HIV follow-up visit. He continues to take his Atripla each evening before bed. He tolerates it well and has not missed any doses. The only other medication he takes is Flonase 2-3 times each week for his seasonal allergies. After his last visit in November he did receive retreatment for syphilis at the health department in [place]. Review of Systems: Pertinent items are noted in HPI.  Past Medical History  Diagnosis Date  . Allergy   . HIV infection     History  Substance Use Topics  . Smoking status: Never Smoker   . Smokeless tobacco: Never Used  . Alcohol Use: 0.5 oz/week    1 drink(s) per week    Family History  Problem Relation Age of  Onset  . Hypertension Mother     No Known Allergies  Objective: Temp: 98.5 F (36.9 C) (07/20 1450) Temp Source: Oral (07/20 1450) BP: 117/76 mmHg (07/20 1450) Pulse Rate: 77 (07/20 1450) Body mass index is 21.66 kg/(m^2).  General: He is smiling and in good spirits Oral: No oropharyngeal lesions Skin: No rash Lungs: Clear Cor: Regular S1 and S2 with no murmurs  Lab Results Lab Results  Component Value Date   WBC 3.8* 04/24/2014   HGB 14.4 04/24/2014   HCT 41.7 04/24/2014   MCV 83.9 04/24/2014   PLT 374 04/24/2014    Lab Results  Component Value Date   CREATININE 0.81 04/24/2014   BUN 10 04/24/2014   NA 140 04/24/2014   K 3.8 04/24/2014   CL 103 04/24/2014   CO2 29 04/24/2014    Lab Results  Component Value Date   ALT 22 04/24/2014   AST 18 04/24/2014   ALKPHOS 63 04/24/2014   BILITOT 0.2 04/24/2014    Lab Results  Component Value Date   CHOL 164 04/24/2014   HDL 90 04/24/2014   LDLCALC 65 04/24/2014   TRIG 43 04/24/2014   CHOLHDL 1.8 04/24/2014    Lab Results HIV 1 RNA QUANT (copies/mL)  Date Value  04/24/2014 <20  05/25/2013 <20  12/14/2012 <20   CD4 T CELL ABS  Date Value  04/24/2014 470 /uL  05/25/2013 430 /uL  12/14/2012 560 cmm     Assessment: His HIV infection has been under excellent control. I will recheck lab work today. I talked him about options with new or 1 pill daily regimens. He agrees to switch to [employer]. He will take it each evening with his dinner meal. I will change Flonase to Beconase due to the interaction of Flonase with Cobicistat. I will recheck his RPR.  Plan: 1. Change Atripla to Genvoya 2. Change Flonase to Beconase 3. Check lab work today 4. Follow-up in 6 months   Andrew Asters, MD [facility] for Infectious Disease [facility] [phone] pager   [phone] cell 01/08/2015, 5:08 PM

## 2015-01-10 LAB — T-HELPER CELL (CD4) - (RCID CLINIC ONLY)
CD4 T CELL HELPER: 27 % — AB (ref 33–55)
CD4 T Cell Abs: 450 /uL (ref 400–2700)

## 2015-01-10 LAB — HIV-1 RNA QUANT-NO REFLEX-BLD
HIV 1 RNA Quant: <20 copies/mL (ref <20)
HIV-1 RNA Quant, Log: <1.3 {Log} (ref <1.30)

## 2015-01-10 LAB — FLUORESCENT TREPONEMAL AB(FTA)-IGG-BLD: FLUORESCENT TREPONEMAL ABS: REACTIVE — AB

## 2016-01-30 ENCOUNTER — Other Ambulatory Visit: Payer: Self-pay | Admitting: Internal Medicine

## 2016-01-30 DIAGNOSIS — B20 Human immunodeficiency virus [HIV] disease: Secondary | ICD-10-CM

## 2016-02-26 ENCOUNTER — Other Ambulatory Visit: Payer: Self-pay

## 2016-02-26 DIAGNOSIS — B20 Human immunodeficiency virus [HIV] disease: Secondary | ICD-10-CM

## 2016-02-26 MED ORDER — ELVITEG-COBIC-EMTRICIT-TENOFAF 150-150-200-10 MG PO TABS: 1.0000 | ORAL_TABLET | Freq: Every day | ORAL | 0 refills | Status: DC

## 2016-04-01 ENCOUNTER — Other Ambulatory Visit: Payer: Self-pay | Admitting: *Deleted

## 2016-04-01 DIAGNOSIS — B20 Human immunodeficiency virus [HIV] disease: Secondary | ICD-10-CM

## 2016-04-01 MED ORDER — ELVITEG-COBIC-EMTRICIT-TENOFAF 150-150-200-10 MG PO TABS: 1.0000 | ORAL_TABLET | Freq: Every day | ORAL | 0 refills | Status: DC

## 2016-04-06 ENCOUNTER — Ambulatory Visit: Payer: Managed Care, Other (non HMO) | Admitting: Internal Medicine

## 2016-04-12 ENCOUNTER — Ambulatory Visit: Payer: Managed Care, Other (non HMO) | Admitting: Internal Medicine

## 2016-05-04 ENCOUNTER — Ambulatory Visit (INDEPENDENT_AMBULATORY_CARE_PROVIDER_SITE_OTHER): Payer: Managed Care, Other (non HMO) | Admitting: Internal Medicine

## 2016-05-04 ENCOUNTER — Encounter: Payer: Self-pay | Admitting: Internal Medicine

## 2016-05-04 DIAGNOSIS — J302 Other seasonal allergic rhinitis: Secondary | ICD-10-CM

## 2016-05-04 DIAGNOSIS — R635 Abnormal weight gain: Secondary | ICD-10-CM | POA: Diagnosis not present

## 2016-05-04 DIAGNOSIS — B20 Human immunodeficiency virus [HIV] disease: Secondary | ICD-10-CM | POA: Diagnosis not present

## 2016-05-04 DIAGNOSIS — A630 Anogenital (venereal) warts: Secondary | ICD-10-CM | POA: Diagnosis not present

## 2016-05-04 DIAGNOSIS — A539 Syphilis, unspecified: Secondary | ICD-10-CM

## 2016-05-04 LAB — COMPREHENSIVE METABOLIC PANEL
ALK PHOS: 55 U/L (ref 40–115)
ALT: 26 U/L (ref 9–46)
AST: 21 U/L (ref 10–40)
Albumin: 4.3 g/dL (ref 3.6–5.1)
BILIRUBIN TOTAL: 0.3 mg/dL (ref 0.2–1.2)
BUN: 6 mg/dL — AB (ref 7–25)
CO2: 29 mmol/L (ref 20–31)
Calcium: 9.5 mg/dL (ref 8.6–10.3)
Chloride: 100 mmol/L (ref 98–110)
Creat: 0.82 mg/dL (ref 0.60–1.35)
GLUCOSE: 83 mg/dL (ref 65–99)
POTASSIUM: 4.2 mmol/L (ref 3.5–5.3)
Sodium: 138 mmol/L (ref 135–146)
Total Protein: 7.6 g/dL (ref 6.1–8.1)

## 2016-05-04 LAB — LIPID PANEL
Cholesterol: 187 mg/dL (ref <200)
HDL: 100 mg/dL (ref >40)
LDL CALC: 73 mg/dL (ref <100)
TRIGLYCERIDES: 70 mg/dL (ref <150)
Total CHOL/HDL Ratio: 1.9 Ratio (ref <5.0)
VLDL: 14 mg/dL (ref <30)

## 2016-05-04 LAB — CBC
HCT: 46 % (ref 38.5–50.0)
Hemoglobin: 15.1 g/dL (ref 13.2–17.1)
MCH: 28.3 pg (ref 27.0–33.0)
MCHC: 32.8 g/dL (ref 32.0–36.0)
MCV: 86.3 fL (ref 80.0–100.0)
MPV: 9.1 fL (ref 7.5–12.5)
PLATELETS: 305 10*3/uL (ref 140–400)
RBC: 5.33 MIL/uL (ref 4.20–5.80)
RDW: 14 % (ref 11.0–15.0)
WBC: 4.3 10*3/uL (ref 3.8–10.8)

## 2016-05-04 LAB — TSH: TSH: 0.91 m[IU]/L (ref 0.40–4.50)

## 2016-05-04 MED ORDER — ELVITEG-COBIC-EMTRICIT-TENOFAF 150-150-200-10 MG PO TABS: 1.0000 | ORAL_TABLET | Freq: Every day | ORAL | 11 refills | Status: DC

## 2016-05-04 MED ORDER — BECLOMETHASONE DIPROP MONOHYD 42 MCG/SPRAY NA SUSP: 2.0000 | Freq: Two times a day (BID) | NASAL | 12 refills | Status: AC

## 2016-05-04 MED ORDER — IMIQUIMOD 5 % EX CREA: TOPICAL_CREAM | CUTANEOUS | 2 refills | Status: AC

## 2016-05-04 NOTE — Assessment & Plan Note (Signed)
I suspect that his weight gain is due to increased caloric intake with very little physical activity and due to the fact that his HIV has been fully suppressed since diagnosis several years ago. I encouraged him to avoid fast food restaurants as much as possible and start getting regular exercise.

## 2016-05-04 NOTE — Assessment & Plan Note (Signed)
His adherence remains very good. I did ask him to take his Genvoya with his evening meal after he gets home from work. He has gotten his influenza vaccination. Will check lab work today and see him back in one year.

## 2016-05-04 NOTE — Assessment & Plan Note (Signed)
The imiquimod cream is helping control his genital warts. I will give him a refill.

## 2016-05-04 NOTE — Progress Notes (Signed)
Patient Active Problem List   Diagnosis Date Noted  . Human immunodeficiency virus (HIV) disease (HCC) 08/15/2007    Priority: High  . Weight gain 05/04/2016  . Syphilis 05/06/2011  . ABSCESS, PERIRECTAL 10/14/2009  . Allergic rhinitis 10/10/2007  . 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 VENEREAL WART 08/15/2007  . PNEUMOCYSTIS PNEUMONIA 08/15/2007  . SHINGLES, HX OF 08/15/2007    Patient's Medications  New Prescriptions   No medications on file  Previous Medications   LORATADINE (CLARITIN) 10 MG TABLET    Take 10 mg by mouth daily.    Modified Medications   Modified Medication Previous Medication   BECLOMETHASONE (BECONASE-AQ) 42 MCG/SPRAY NASAL SPRAY beclomethasone (BECONASE-AQ) 42 MCG/SPRAY nasal spray      Place 2 sprays into both nostrils 2 (two) times daily. Dose is for each nostril.    Place 2 sprays into both nostrils 2 (two) times daily. Dose is for each nostril.   ELVITEGRAVIR-COBICISTAT-EMTRICITABINE-TENOFOVIR (GENVOYA) 150-150-200-10 MG TABS TABLET elvitegravir-cobicistat-emtricitabine-tenofovir (GENVOYA) 150-150-200-10 MG TABS tablet      Take 1 tablet by mouth daily with breakfast.    Take 1 tablet by mouth daily with breakfast.   IMIQUIMOD (ALDARA) 5 % CREAM imiquimod (ALDARA) 5 % cream      APPLY TOPICALLY 3 TIMES A WEEK    APPLY TOPICALLY 3 TIMES A WEEK  Discontinued Medications   No medications on file    Subjective: Andrew Alvarez Is in for his first HIV follow-up visit since July of last year. He has been feeling well with the exception of being somewhat worried about his recent weight gain. He estimates that he eats out in fast food restaurants or the [employer] about 50% of the time. He gets no regular exercise. He has had no problems getting his Genvoya. He recalls missing only one dose since his last visit. He generally takes it around bedtime on an empty stomach. He is currently sexually active with one long-term male partner who  is HIV negative. His partner is on PrEP.  Review of Systems: Review of Systems  Constitutional: Negative for chills, diaphoresis, fever, malaise/fatigue and weight loss.  HENT: Negative for sore throat.   Respiratory: Negative for cough, sputum production and shortness of breath.   Cardiovascular: Negative for chest pain.  Gastrointestinal: Negative for abdominal pain, diarrhea, nausea and vomiting.  Genitourinary: Negative for dysuria and frequency.  Musculoskeletal: Negative for joint pain and myalgias.  Skin: Negative for rash.  Neurological: Negative for dizziness and headaches.  Psychiatric/Behavioral: Negative for depression and substance abuse. The patient is not nervous/anxious.     Past Medical History:  Diagnosis Date  . Allergy   . HIV infection [facility](HCC)     Social History  Substance Use Topics  . Smoking status: Never Smoker  . Smokeless tobacco: Never Used  . Alcohol use 0.5 oz/week    1 drink(s) per week    Family History  Problem Relation Age of Onset  . Hypertension Mother     No Known Allergies  Objective:  Vitals:   05/04/16 1422  BP: 128/85  Pulse: 75  Temp: 97.8 F (36.6 C)  TempSrc: Oral  Weight: 178 lb (80.7 kg)  Height: 5\' 9"  (1.753 m)   Body mass index is 26.29 kg/m.  Physical Exam  Constitutional: He is oriented to person, place, and time.  He is in good spirits. He has gained about 30 pounds in the past year.  HENT:  Mouth/Throat: No oropharyngeal exudate.  Eyes: Conjunctivae are normal.  Cardiovascular: Normal rate and regular rhythm.   No murmur heard. Pulmonary/Chest: Effort normal and breath sounds normal.  Abdominal: Soft. He exhibits no mass. There is no tenderness.  Musculoskeletal: Normal range of motion.  Neurological: He is alert and oriented to person, place, and time.  Skin: No rash noted.  Psychiatric: Mood and affect normal.    Lab Results Lab Results  Component Value Date   WBC 3.4 (L) 01/08/2015   HGB 15.3  01/08/2015   HCT 45.1 01/08/2015   MCV 86.1 01/08/2015   PLT 361 01/08/2015    Lab Results  Component Value Date   CREATININE 0.77 01/08/2015   BUN 8 01/08/2015   NA 141 01/08/2015   K 4.6 01/08/2015   CL 101 01/08/2015   CO2 28 01/08/2015    Lab Results  Component Value Date   ALT 18 01/08/2015   AST 18 01/08/2015   ALKPHOS 60 01/08/2015   BILITOT 0.3 01/08/2015    Lab Results  Component Value Date   CHOL 179 01/08/2015   HDL 106 01/08/2015   LDLCALC 64 01/08/2015   TRIG 47 01/08/2015   CHOLHDL 1.7 01/08/2015   HIV 1 RNA Quant (copies/mL)  Date Value  01/08/2015 <20  04/24/2014 <20  05/25/2013 <20   CD4 T Cell Abs (/uL)  Date Value  01/08/2015 450  04/24/2014 470  05/25/2013 430     Problem List Items Addressed This Visit      High   Human immunodeficiency virus (HIV) disease (HCC)    His adherence remains very good. I did ask him to take his Genvoya with his evening meal after he gets home from work. He has gotten his influenza vaccination. Will check lab work today and see him back in one year.      Relevant Medications   elvitegravir-cobicistat-emtricitabine-tenofovir (GENVOYA) 150-150-200-10 MG TABS tablet   imiquimod (ALDARA) 5 % cream   Other Relevant Orders   T-helper cell (CD4)- ([facility] clinic only)   HIV 1 RNA quant-no reflex-bld   CBC   Comprehensive metabolic panel   RPR   Lipid panel     Unprioritized   Syphilis    I will obtain a repeat RPR today.      Relevant Medications   elvitegravir-cobicistat-emtricitabine-tenofovir (GENVOYA) 150-150-200-10 MG TABS tablet   imiquimod (ALDARA) 5 % cream   VENEREAL WART    The imiquimod cream is helping control his genital warts. I will give him a refill.      Relevant Medications   elvitegravir-cobicistat-emtricitabine-tenofovir (GENVOYA) 150-150-200-10 MG TABS tablet   imiquimod (ALDARA) 5 % cream   Weight gain    I suspect that his weight gain is due to increased caloric intake with very  little physical activity and due to the fact that his HIV has been fully suppressed since diagnosis several years ago. I encouraged him to avoid fast food restaurants as much as possible and start getting regular exercise.      Relevant Orders   TSH    Other Visit Diagnoses    Genital warts       Relevant Medications   elvitegravir-cobicistat-emtricitabine-tenofovir (GENVOYA) 150-150-200-10 MG TABS tablet   imiquimod (ALDARA) 5 % cream   Other seasonal allergic rhinitis       Relevant Medications   beclomethasone (BECONASE-AQ) 42 MCG/SPRAY nasal spray        Cliffton Asters, MD [facility] for Infectious Disease [facility] [phone]  pager   [phone] cell 05/04/2016, 2:52 PM

## 2016-05-04 NOTE — Assessment & Plan Note (Signed)
I will obtain a repeat RPR today.

## 2016-05-05 LAB — FLUORESCENT TREPONEMAL AB(FTA)-IGG-BLD: Fluorescent Treponemal ABS: REACTIVE — AB

## 2016-05-05 LAB — T-HELPER CELL (CD4) - (RCID CLINIC ONLY)
CD4 % Helper T Cell: 25 % — ABNORMAL LOW (ref 33–55)
CD4 T CELL ABS: 460 /uL (ref 400–2700)

## 2016-05-05 LAB — RPR TITER

## 2016-05-05 LAB — RPR: RPR Ser Ql: REACTIVE — AB

## 2016-05-07 LAB — HIV-1 RNA QUANT-NO REFLEX-BLD
HIV 1 RNA QUANT: 36 {copies}/mL — AB (ref <20)
HIV-1 RNA QUANT, LOG: 1.56 {Log_copies}/mL — AB (ref <1.30)

## 2016-05-18 ENCOUNTER — Telehealth: Payer: Self-pay | Admitting: *Deleted

## 2016-05-18 NOTE — Telephone Encounter (Signed)
Patient called asking for results. Please advise. Andree CossHowell, Kemisha Bonnette M, RN

## 2016-05-18 NOTE — Telephone Encounter (Signed)
Please let him know that his blood work looks very good (his CD4 count is normal at over 400 and his viral load is just barely detectable at 36). Also remind him that he can see his results on MyChart.

## 2016-05-19 NOTE — Telephone Encounter (Signed)
RPR relatively low and stable following treatment. Observe for now.

## 2016-05-19 NOTE — Telephone Encounter (Signed)
Thank you :)

## 2016-05-19 NOTE — Telephone Encounter (Signed)
Is his RPR titer ok? Does he need additional treatment?

## 2016-07-06 ENCOUNTER — Other Ambulatory Visit: Payer: Self-pay

## 2016-07-06 NOTE — Telephone Encounter (Signed)
Patient  Left  Voice mail message stating he would need medication sent to a different pharmacy.   He did not leave pharmacy name.

## 2016-07-22 ENCOUNTER — Other Ambulatory Visit: Payer: Self-pay | Admitting: *Deleted

## 2016-07-22 ENCOUNTER — Telehealth: Payer: Self-pay | Admitting: *Deleted

## 2016-07-22 DIAGNOSIS — B20 Human immunodeficiency virus [HIV] disease: Secondary | ICD-10-CM

## 2016-07-22 MED ORDER — ELVITEG-COBIC-EMTRICIT-TENOFAF 150-150-200-10 MG PO TABS: 1.0000 | ORAL_TABLET | Freq: Every day | ORAL | 6 refills | Status: DC

## 2016-07-22 NOTE — Telephone Encounter (Signed)
Patient called stating his pharmacy has changed to [facility] and [facility]. Wendall MolaJacqueline Kassie Keng

## 2016-07-28 ENCOUNTER — Telehealth: Payer: Self-pay | Admitting: *Deleted

## 2016-07-28 ENCOUNTER — Other Ambulatory Visit: Payer: Self-pay | Admitting: *Deleted

## 2016-07-28 DIAGNOSIS — B20 Human immunodeficiency virus [HIV] disease: Secondary | ICD-10-CM

## 2016-07-28 DIAGNOSIS — J302 Other seasonal allergic rhinitis: Secondary | ICD-10-CM

## 2016-07-28 MED ORDER — ELVITEG-COBIC-EMTRICIT-TENOFAF 150-150-200-10 MG PO TABS: 1.0000 | ORAL_TABLET | Freq: Every day | ORAL | 6 refills | Status: DC

## 2016-07-28 NOTE — Telephone Encounter (Signed)
Genvoya sent to wrong [facility]. [place]. Called into correct pharmacy. Wendall MolaJacqueline Danita Proud

## 2016-08-02 ENCOUNTER — Telehealth: Payer: Self-pay | Admitting: *Deleted

## 2016-08-02 NOTE — Telephone Encounter (Signed)
RN shared most recent RPR titre and the previous titire from 2016, 1:8.  No change.  Patient verbalized understanding.

## 2017-01-10 ENCOUNTER — Other Ambulatory Visit: Payer: Self-pay | Admitting: Internal Medicine

## 2017-01-10 DIAGNOSIS — B20 Human immunodeficiency virus [HIV] disease: Secondary | ICD-10-CM

## 2017-08-16 ENCOUNTER — Other Ambulatory Visit: Payer: Self-pay | Admitting: Internal Medicine

## 2017-08-16 ENCOUNTER — Telehealth: Payer: Self-pay | Admitting: *Deleted

## 2017-08-16 DIAGNOSIS — B20 Human immunodeficiency virus [HIV] disease: Secondary | ICD-10-CM

## 2017-08-16 NOTE — Telephone Encounter (Signed)
Refill request received in triage. Patient's last visit was 04/2016, was supposed to follow up in 1 year. RN gave 30 day supply, left message asking patient to call and schedule an appointment for future refills. Andree CossHowell, Suzi Hernan M, RN

## 2017-09-12 ENCOUNTER — Other Ambulatory Visit: Payer: Self-pay | Admitting: Internal Medicine

## 2017-09-12 DIAGNOSIS — B20 Human immunodeficiency virus [HIV] disease: Secondary | ICD-10-CM

## 2017-09-22 ENCOUNTER — Encounter: Payer: Self-pay | Admitting: Internal Medicine

## 2017-09-22 ENCOUNTER — Ambulatory Visit (INDEPENDENT_AMBULATORY_CARE_PROVIDER_SITE_OTHER): Payer: BLUE CROSS/BLUE SHIELD | Admitting: Internal Medicine

## 2017-09-22 DIAGNOSIS — A630 Anogenital (venereal) warts: Secondary | ICD-10-CM | POA: Diagnosis not present

## 2017-09-22 DIAGNOSIS — A539 Syphilis, unspecified: Secondary | ICD-10-CM | POA: Diagnosis not present

## 2017-09-22 DIAGNOSIS — B20 Human immunodeficiency virus [HIV] disease: Secondary | ICD-10-CM

## 2017-09-22 NOTE — Progress Notes (Signed)
Patient Active Problem List   Diagnosis Date Noted  . Human immunodeficiency virus (HIV) disease (HCC) 08/15/2007    Priority: High  . Weight gain 05/04/2016  . Syphilis 05/06/2011  . ABSCESS, PERIRECTAL 10/14/2009  . Allergic rhinitis 10/10/2007  . 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 VENEREAL WART 08/15/2007  . PNEUMOCYSTIS PNEUMONIA 08/15/2007  . SHINGLES, HX OF 08/15/2007    Patient's Medications  New Prescriptions   No medications on file  Previous Medications   BECLOMETHASONE (BECONASE-AQ) 42 MCG/SPRAY NASAL SPRAY    Place 2 sprays into both nostrils 2 (two) times daily. Dose is for each nostril.   FLUTICASONE (FLONASE) 50 MCG/ACT NASAL SPRAY    Place 1 spray into both nostrils as needed for allergies or rhinitis.   GENVOYA 150-150-200-10 MG TABS TABLET    TAKE ONE TABLET BY MOUTH ONCE DAILY WITH FOOD. STORE IN ORIGINAL CONTAINER AT ROOM TEMPERATURE.   IMIQUIMOD (ALDARA) 5 % CREAM    APPLY TOPICALLY 3 TIMES A WEEK   LORATADINE (CLARITIN) 10 MG TABLET    Take 10 mg by mouth daily.    Modified Medications   No medications on file  Discontinued Medications   No medications on file    Subjective: Andrew Alvarez is in for his routine HIV follow-up visit.  He has had no problems obtaining, taking or tolerating his Genvoya and does not recall missing doses.  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  He enjoys his work.  He does not get any regular exercise.  He has no current health concerns.  He did see a dermatologist last year and underwent cryotherapy on several occasions for his perirectal warts.  They were not responding to imiquimod cream.  Review of Systems: Review of Systems  Constitutional: Negative for chills, diaphoresis, fever, malaise/fatigue and weight loss.  HENT: Negative for sore throat.   Respiratory: Negative for cough, sputum production and shortness of breath.   Cardiovascular: Negative for  chest pain.  Gastrointestinal: Negative for abdominal pain, diarrhea, heartburn, nausea and vomiting.  Genitourinary: Negative for dysuria and frequency.  Musculoskeletal: Negative for joint pain and myalgias.  Skin: Negative for rash.  Neurological: Negative for dizziness and headaches.  Psychiatric/Behavioral: Negative for depression and substance abuse. The patient is not nervous/anxious.     Past Medical History:  Diagnosis Date  . Allergy   . HIV infection (HCC)     Social History   Tobacco Use  . Smoking status: Never Smoker  . Smokeless tobacco: Never Used  Substance Use Topics  . Alcohol use: Yes    Alcohol/week: 0.5 oz    Types: 1 Standard drinks or equivalent per week  . Drug use: No    Family History  Problem Relation Age of Onset  . Hypertension Mother     No Known Allergies  Health Maintenance  Topic Date Due  . TETANUS/TDAP  01/06/1992  . INFLUENZA VACCINE  01/19/2018  . HIV Screening  Completed    Objective:  Vitals:   09/22/17 1408  BP: 134/82  Pulse: 80  Temp: 97.8 F (36.6 C)  TempSrc: Oral  Weight: 188 lb (85.3 kg)  Height: 5\' 9"  (1.753 m)   Body mass index is 27.76 kg/m.  Physical Exam  Constitutional: He is oriented to person, place, and time.  He is pleasant and in good spirits.  HENT:  Mouth/Throat: No oropharyngeal exudate.  Eyes: Conjunctivae  are normal.  Cardiovascular: Normal rate and regular rhythm.  No murmur heard. Pulmonary/Chest: Effort normal and breath sounds normal.  Abdominal: Soft. He exhibits no mass. There is no tenderness.  Musculoskeletal: Normal range of motion.  Neurological: He is alert and oriented to person, place, and time.  Skin: No rash noted.  Psychiatric: Mood and affect normal.    Lab Results Lab Results  Component Value Date   WBC 4.3 05/04/2016   HGB 15.1 05/04/2016   HCT 46.0 05/04/2016   MCV 86.3 05/04/2016   PLT 305 05/04/2016    Lab Results  Component Value Date   CREATININE  0.82 05/04/2016   BUN 6 (L) 05/04/2016   NA 138 05/04/2016   K 4.2 05/04/2016   CL 100 05/04/2016   CO2 29 05/04/2016    Lab Results  Component Value Date   ALT 26 05/04/2016   AST 21 05/04/2016   ALKPHOS 55 05/04/2016   BILITOT 0.3 05/04/2016    Lab Results  Component Value Date   CHOL 187 05/04/2016   HDL 100 05/04/2016   LDLCALC 73 05/04/2016   TRIG 70 05/04/2016   CHOLHDL 1.9 05/04/2016   Lab Results  Component Value Date   LABRPR REACTIVE (A) 05/04/2016   RPRTITER 1:8 05/04/2016   HIV 1 RNA Quant (copies/mL)  Date Value  05/04/2016 36 (H)  01/08/2015 <20  04/24/2014 <20   CD4 T Cell Abs (/uL)  Date Value  05/04/2016 460  01/08/2015 450  04/24/2014 470     Problem List Items Addressed This Visit      High   Human immunodeficiency virus (HIV) disease (HCC)    His adherence is perfect.  His infection has been under excellent long-term control.  He will get repeat lab work today, continue [place] and follow-up in 1 year.      Relevant Orders   T-helper cell (CD4)- ([facility] clinic only)   HIV 1 RNA quant-no reflex-bld   CBC   Comprehensive metabolic panel   RPR   Lipid panel     Unprioritized   Syphilis    He will have a repeat RPR today.      VENEREAL WART    I will refer him to the anal Pap clinic run by my partner, Dr. Enedina Finner.           Cliffton Asters, MD [facility] [phone] pager   [phone] cell 09/22/2017, 2:35 PM

## 2017-09-22 NOTE — Assessment & Plan Note (Signed)
He will have a repeat RPR today.

## 2017-09-22 NOTE — Assessment & Plan Note (Signed)
I will refer him to the anal Pap clinic run by my partner, Dr. Enedina FinnerJeff Hatcher.

## 2017-09-22 NOTE — Assessment & Plan Note (Signed)
His adherence is perfect.  His infection has been under excellent long-term control.  He will get repeat lab work today, continue [place] and follow-up in 1 year.

## 2017-09-23 LAB — COMPREHENSIVE METABOLIC PANEL
AG Ratio: 1.4 (calc) (ref 1.0–2.5)
ALBUMIN MSPROF: 4.6 g/dL (ref 3.6–5.1)
ALT: 22 U/L (ref 9–46)
AST: 17 U/L (ref 10–40)
Alkaline phosphatase (APISO): 63 U/L (ref 40–115)
BILIRUBIN TOTAL: 0.3 mg/dL (ref 0.2–1.2)
BUN: 8 mg/dL (ref 7–25)
CALCIUM: 9.5 mg/dL (ref 8.6–10.3)
CHLORIDE: 101 mmol/L (ref 98–110)
CO2: 31 mmol/L (ref 20–32)
Creat: 0.83 mg/dL (ref 0.60–1.35)
GLOBULIN: 3.3 g/dL (ref 1.9–3.7)
Glucose, Bld: 88 mg/dL (ref 65–99)
POTASSIUM: 4.2 mmol/L (ref 3.5–5.3)
Sodium: 140 mmol/L (ref 135–146)
Total Protein: 7.9 g/dL (ref 6.1–8.1)

## 2017-09-23 LAB — RPR TITER: RPR Titer: 1:8 {titer} — ABNORMAL HIGH

## 2017-09-23 LAB — CBC
HEMATOCRIT: 45.3 % (ref 38.5–50.0)
Hemoglobin: 15.3 g/dL (ref 13.2–17.1)
MCH: 28.1 pg (ref 27.0–33.0)
MCHC: 33.8 g/dL (ref 32.0–36.0)
MCV: 83.3 fL (ref 80.0–100.0)
MPV: 9 fL (ref 7.5–12.5)
Platelets: 363 10*3/uL (ref 140–400)
RBC: 5.44 10*6/uL (ref 4.20–5.80)
RDW: 13 % (ref 11.0–15.0)
WBC: 3.9 10*3/uL (ref 3.8–10.8)

## 2017-09-23 LAB — T-HELPER CELL (CD4) - (RCID CLINIC ONLY)
CD4 T CELL HELPER: 29 % — AB (ref 33–55)
CD4 T Cell Abs: 740 /uL (ref 400–2700)

## 2017-09-23 LAB — RPR: RPR: REACTIVE — AB

## 2017-09-23 LAB — LIPID PANEL
CHOLESTEROL: 197 mg/dL (ref <200)
HDL: 77 mg/dL (ref >40)
LDL Cholesterol (Calc): 105 mg/dL (calc) — ABNORMAL HIGH
Non-HDL Cholesterol (Calc): 120 mg/dL (calc) (ref <130)
Total CHOL/HDL Ratio: 2.6 (calc) (ref <5.0)
Triglycerides: 61 mg/dL (ref <150)

## 2017-09-23 LAB — FLUORESCENT TREPONEMAL AB(FTA)-IGG-BLD: Fluorescent Treponemal ABS: REACTIVE — AB

## 2017-09-24 LAB — HIV-1 RNA QUANT-NO REFLEX-BLD
HIV 1 RNA Quant: <20 copies/mL
HIV-1 RNA Quant, Log: <1.3 Log copies/mL

## 2017-09-29 ENCOUNTER — Encounter: Payer: Self-pay | Admitting: Internal Medicine

## 2017-10-21 ENCOUNTER — Telehealth: Payer: Self-pay

## 2017-10-21 NOTE — Telephone Encounter (Signed)
Called pt to schedule an appt for HRA with Dr. Ninetta Lights per Dr. Orvan Falconer. Pt is currently scheduled for 11/04/17 @ 11 am. Pt will call us if he can not make this appt since he will be in [place] the day before. Asked pt not to wait too close to the appt date his he needs to reschedule. Pt verbalized understanding and will call us if he has any questions or needs to cancel his appt. Andrew Alvarez, [place]

## 2017-11-04 ENCOUNTER — Ambulatory Visit (INDEPENDENT_AMBULATORY_CARE_PROVIDER_SITE_OTHER): Payer: BLUE CROSS/BLUE SHIELD | Admitting: Infectious Diseases

## 2017-11-04 DIAGNOSIS — A630 Anogenital (venereal) warts: Secondary | ICD-10-CM | POA: Diagnosis not present

## 2017-11-04 NOTE — Progress Notes (Signed)
  47 yo M with hx of HIV+, on genvoya and peri-rectal warts. These have been treated in the past with imiquimod (given by derm). He has previously had cryotherapy.  He has noted recurrence.  Has had occas bleeding, when moving bowels.  Has had prev peri-anal abscesses.   PRE-OPERATIVE DIAGNOSIS:  Anal condyloma, HRA with bx  POST-OPERATIVE DIAGNOSIS:  Anal condyloma, HRA with bx  PROCEDURE:    SURGEON:  Giulliana Mcroberts  ASSISTANT: [place]  ANESTHESIA:   local  EBL: < 10 cc   SPECIMEN:  No Specimen  DISPOSITION OF SPECIMEN:  N/A  COUNTS:  YES  PLAN OF CARE: home  PATIENT DISPOSITION:  home  INDICATION: condylpoma  OR FINDINGS:  Dysplasia, large area of condylomas  DESCRIPTION: The patient was identified in the waiting area and taken to the exam room where they were laid on the table in the lateral decubitus position.  The patient was then prepped and draped in the usual fashion. A surgical timeout was performed indicating the correct patient, procedure, positioning.   After this was completed, a sponge was soaked in 2% acetic acid was placed over the perianal region. This was allowed to soak for 1 minute. The sponge was removed and the perianal region was evaluated with a colposcope.  There were several external condyloma.  The internal anal canal was evaluated via anoscopy with an anoscope.  There was a large area of dysplasia at the 7-10 oclock area. There was a large area with multiple raised condyloma at the 2-4 o'clock area.  After this was completed, hemostasis was achieved with gauze.    PLAN:  Anal dysplasia, Condyloma  The pt and I spoke that he had a large of of dysplasia and a large area of condyloma. I feel that he will need surgical eval regardless of a bx today. We agreed to defer his biopsy and to have him eval by surgery.  Will send him to Dr Judeen Hammans in [place]  **Disclaimer: This note may have been dictated with voice recognition software. Similar sounding  words can inadvertently be transcribed and this note may contain transcription errors which may not have been corrected upon publication of note.**

## 2017-11-07 ENCOUNTER — Other Ambulatory Visit: Payer: Self-pay | Admitting: Internal Medicine

## 2017-11-07 DIAGNOSIS — B20 Human immunodeficiency virus [HIV] disease: Secondary | ICD-10-CM

## 2017-11-10 ENCOUNTER — Encounter: Payer: Self-pay | Admitting: Infectious Diseases

## 2017-11-28 ENCOUNTER — Encounter: Payer: Self-pay | Admitting: Infectious Diseases

## 2017-11-29 ENCOUNTER — Telehealth: Payer: Self-pay | Admitting: *Deleted

## 2017-11-29 NOTE — Telephone Encounter (Signed)
Information sent to Dr Judeen HammansBradley Davis [address], [place], [place] [address]  Phone: [phone] per Dr Ninetta LightsHatcher request and patient informed via my chart.  Fax #[phone]

## 2018-10-24 ENCOUNTER — Other Ambulatory Visit: Payer: Self-pay | Admitting: Internal Medicine

## 2018-10-24 DIAGNOSIS — B20 Human immunodeficiency virus [HIV] disease: Secondary | ICD-10-CM

## 2018-10-31 ENCOUNTER — Encounter: Payer: Self-pay | Admitting: Internal Medicine

## 2018-10-31 ENCOUNTER — Other Ambulatory Visit: Payer: Self-pay

## 2018-10-31 ENCOUNTER — Ambulatory Visit (INDEPENDENT_AMBULATORY_CARE_PROVIDER_SITE_OTHER): Payer: Managed Care, Other (non HMO) | Admitting: Internal Medicine

## 2018-10-31 DIAGNOSIS — R635 Abnormal weight gain: Secondary | ICD-10-CM | POA: Diagnosis not present

## 2018-10-31 DIAGNOSIS — K6282 Dysplasia of anus: Secondary | ICD-10-CM | POA: Diagnosis not present

## 2018-10-31 DIAGNOSIS — B20 Human immunodeficiency virus [HIV] disease: Secondary | ICD-10-CM

## 2018-10-31 MED ORDER — BICTEGRAVIR-EMTRICITAB-TENOFOV 50-200-25 MG PO TABS: 1.0000 | ORAL_TABLET | Freq: Every day | ORAL | 11 refills | Status: DC

## 2018-10-31 NOTE — Assessment & Plan Note (Signed)
He will work with his PCP to arrange appropriate follow-up in [place].

## 2018-10-31 NOTE — Assessment & Plan Note (Signed)
I talked to him about the importance of lifestyle modification to address weight gain and hyperglycemia.

## 2018-10-31 NOTE — Progress Notes (Signed)
Office visit notes faxed to PCP Dr Fernande Bras [facility] at [phone].  Valarie Cones, LPN

## 2018-10-31 NOTE — Progress Notes (Signed)
Patient Active Problem List   Diagnosis Date Noted  . Human immunodeficiency virus (HIV) disease (HCC) 08/15/2007    Priority: High  . Anal dysplasia 10/31/2018  . Weight gain 05/04/2016  . Syphilis 05/06/2011  . ABSCESS, PERIRECTAL 10/14/2009  . Allergic rhinitis 10/10/2007  . 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 VENEREAL WART 08/15/2007  . PNEUMOCYSTIS PNEUMONIA 08/15/2007  . SHINGLES, HX OF 08/15/2007    Patient's Medications  New Prescriptions   BICTEGRAVIR-EMTRICITABINE-TENOFOVIR AF (BIKTARVY) 50-200-25 MG TABS TABLET    Take 1 tablet by mouth daily.  Previous Medications   BECLOMETHASONE (BECONASE-AQ) 42 MCG/SPRAY NASAL SPRAY    Place 2 sprays into both nostrils 2 (two) times daily. Dose is for each nostril.   FLUTICASONE (FLONASE) 50 MCG/ACT NASAL SPRAY    Place 1 spray into both nostrils as needed for allergies or rhinitis.   IMIQUIMOD (ALDARA) 5 % CREAM    APPLY TOPICALLY 3 TIMES A WEEK   LORATADINE (CLARITIN) 10 MG TABLET    Take 10 mg by mouth daily.    Modified Medications   No medications on file  Discontinued Medications   GENVOYA 150-150-200-10 MG TABS TABLET    TAKE ONE TABLET BY MOUTH ONCE DAILY WITH FOOD. STORE IN ORIGINAL CONTAINER AT ROOM TEMPERATURE.    Subjective: Andrew Alvarez is in for his routine HIV follow-up visit.  He denies any problems obtaining, taking or tolerating his Genvoya and does not recall missing any doses.  He was on Flonase for a while for his allergies then changed to Beconase and is now on Nasacort.  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  After his last visit he was seen by my partner, Dr. Ninetta Lights, and referred to Dr. Judeen Hammans in [place] for his anal dysplasia.  He did undergo a procedure and was told that he had a hemorrhoidectomy at the same time.  He is still having some occasional spotting of blood.   He says that he spoke with his primary care provider about seeing a different provider in [place].  He recently saw Dr. Franz Dell, his PCP.  His routine blood work was good except for a very slight elevation of his glucose to 117 and borderline A1c of 5.7%.  Review of Systems: Review of Systems  Constitutional: Negative for chills, diaphoresis, fever, malaise/fatigue and weight loss.  HENT: Negative for sore throat.   Respiratory: Negative for cough, sputum production and shortness of breath.   Cardiovascular: Negative for chest pain.  Gastrointestinal: Positive for blood in stool. Negative for abdominal pain, diarrhea, heartburn, nausea and vomiting.  Genitourinary: Negative for dysuria and frequency.  Musculoskeletal: Negative for joint pain and myalgias.  Skin: Negative for rash.  Neurological: Negative for dizziness and headaches.  Psychiatric/Behavioral: Negative for depression and substance abuse. The patient is not nervous/anxious.     Past Medical History:  Diagnosis Date  . Allergy   . HIV infection (HCC)     Social History   Tobacco Use  . Smoking status: Never Smoker  . Smokeless tobacco: Never Used  Substance Use Topics  . Alcohol use: Yes    Alcohol/week: 1.0 standard drinks    Types: 1 Standard drinks or equivalent per week  . Drug use: No    Family History  Problem Relation Age of Onset  . Hypertension Mother  No Known Allergies  Health Maintenance  Topic Date Due  . TETANUS/TDAP  01/06/1992  . INFLUENZA VACCINE  01/20/2019  . HIV Screening  Completed    Objective:  There were no vitals filed for this visit. There is no height or weight on file to calculate BMI.  Physical Exam Constitutional:      Comments: He is in good spirits as usual.  HENT:     Mouth/Throat:     Pharynx: No oropharyngeal exudate.  Eyes:     Conjunctiva/sclera: Conjunctivae normal.  Cardiovascular:     Rate and Rhythm: Normal rate and regular rhythm.     Heart  sounds: No murmur.  Pulmonary:     Effort: Pulmonary effort is normal.     Breath sounds: Normal breath sounds.  Abdominal:     Palpations: Abdomen is soft. There is no mass.     Tenderness: There is no abdominal tenderness.  Musculoskeletal: Normal range of motion.  Skin:    Findings: No rash.  Neurological:     Mental Status: He is alert and oriented to person, place, and time.  Psychiatric:        Mood and Affect: Mood normal.     Lab Results Lab Results  Component Value Date   WBC 3.9 09/22/2017   HGB 15.3 09/22/2017   HCT 45.3 09/22/2017   MCV 83.3 09/22/2017   PLT 363 09/22/2017    Lab Results  Component Value Date   CREATININE 0.83 09/22/2017   BUN 8 09/22/2017   NA 140 09/22/2017   K 4.2 09/22/2017   CL 101 09/22/2017   CO2 31 09/22/2017    Lab Results  Component Value Date   ALT 22 09/22/2017   AST 17 09/22/2017   ALKPHOS 55 05/04/2016   BILITOT 0.3 09/22/2017    Lab Results  Component Value Date   CHOL 197 09/22/2017   HDL 77 09/22/2017   LDLCALC 105 (H) 09/22/2017   TRIG 61 09/22/2017   CHOLHDL 2.6 09/22/2017   Lab Results  Component Value Date   LABRPR REACTIVE (A) 09/22/2017   RPRTITER 1:8 (H) 09/22/2017   HIV 1 RNA Quant (copies/mL)  Date Value  09/22/2017 <20 NOT DETECTED  05/04/2016 36 (H)  01/08/2015 <20   CD4 T Cell Abs (/uL)  Date Value  09/22/2017 740  05/04/2016 460  01/08/2015 450     Problem List Items Addressed This Visit      High   Human immunodeficiency virus (HIV) disease (HCC)    His adherence is excellent and his infection has been under very good long-term control.  I will change Genvoya to Biktarvy to avoid potential problems with drug drug interactions in the future.  I would allow him to return to taking Flonase if necessary.  He will get blood work today and follow-up in 1 year      Relevant Medications   bictegravir-emtricitabine-tenofovir AF (BIKTARVY) 50-200-25 MG TABS tablet   Other Relevant Orders    T-helper cell (CD4)- ([facility] clinic only)   HIV-1 RNA quant-no reflex-bld   RPR     Unprioritized   Weight gain    I talked to him about the importance of lifestyle modification to address weight gain and hyperglycemia.      Anal dysplasia    He will work with his PCP to arrange appropriate follow-up in [place].           Cliffton Asters, MD [facility]  [phone] pager   [phone] cell 10/31/2018, 9:59 AM

## 2018-10-31 NOTE — Assessment & Plan Note (Signed)
His adherence is excellent and his infection has been under very good long-term control.  I will change Genvoya to Biktarvy to avoid potential problems with drug drug interactions in the future.  I would allow him to return to taking Flonase if necessary.  He will get blood work today and follow-up in 1 year

## 2018-11-01 LAB — T-HELPER CELL (CD4) - (RCID CLINIC ONLY)
CD4 % Helper T Cell: 27 % — ABNORMAL LOW (ref 33–65)
CD4 T Cell Abs: 636 /uL (ref 400–1790)

## 2018-11-07 LAB — FLUORESCENT TREPONEMAL AB(FTA)-IGG-BLD: Fluorescent Treponemal ABS: REACTIVE — AB

## 2018-11-07 LAB — RPR TITER: RPR Titer: 1:4 {titer} — ABNORMAL HIGH

## 2018-11-07 LAB — HIV-1 RNA QUANT-NO REFLEX-BLD
HIV 1 RNA Quant: <20 copies/mL — AB
HIV-1 RNA Quant, Log: <1.3 Log copies/mL — AB

## 2018-11-07 LAB — RPR: RPR Ser Ql: REACTIVE — AB

## 2019-08-24 ENCOUNTER — Other Ambulatory Visit: Payer: Self-pay | Admitting: Internal Medicine

## 2019-08-24 DIAGNOSIS — B20 Human immunodeficiency virus [HIV] disease: Secondary | ICD-10-CM

## 2019-10-23 ENCOUNTER — Ambulatory Visit: Payer: BLUE CROSS/BLUE SHIELD | Admitting: Internal Medicine

## 2020-01-29 ENCOUNTER — Other Ambulatory Visit: Payer: Self-pay | Admitting: Internal Medicine

## 2020-01-29 DIAGNOSIS — B20 Human immunodeficiency virus [HIV] disease: Secondary | ICD-10-CM

## 2020-02-18 ENCOUNTER — Other Ambulatory Visit: Payer: Self-pay | Admitting: Internal Medicine

## 2020-02-18 DIAGNOSIS — B20 Human immunodeficiency virus [HIV] disease: Secondary | ICD-10-CM

## 2020-04-11 ENCOUNTER — Other Ambulatory Visit: Payer: Self-pay | Admitting: Internal Medicine

## 2020-04-11 DIAGNOSIS — B20 Human immunodeficiency virus [HIV] disease: Secondary | ICD-10-CM

## 2020-05-05 ENCOUNTER — Other Ambulatory Visit: Payer: Self-pay | Admitting: Internal Medicine

## 2020-05-05 DIAGNOSIS — B20 Human immunodeficiency virus [HIV] disease: Secondary | ICD-10-CM

## 2020-05-13 ENCOUNTER — Other Ambulatory Visit: Payer: Self-pay | Admitting: Internal Medicine

## 2020-05-13 DIAGNOSIS — B20 Human immunodeficiency virus [HIV] disease: Secondary | ICD-10-CM
